# Patient Record
Sex: Male | Born: 1975 | Hispanic: Yes | Marital: Married | Smoking: Current some day smoker
Health system: Southern US, Community
[De-identification: ages and names within clinical notes are randomized; demographics above are authoritative.]

## PROBLEM LIST (undated history)

## (undated) DIAGNOSIS — J302 Other seasonal allergic rhinitis: Secondary | ICD-10-CM

## (undated) DIAGNOSIS — I10 Essential (primary) hypertension: Secondary | ICD-10-CM

## (undated) HISTORY — PX: WISDOM TOOTH EXTRACTION: SHX21

## (undated) HISTORY — PX: LASIK: SHX215

## (undated) HISTORY — DX: Other seasonal allergic rhinitis: J30.2

## (undated) HISTORY — DX: Essential (primary) hypertension: I10

---

## 2016-01-24 ENCOUNTER — Encounter (INDEPENDENT_AMBULATORY_CARE_PROVIDER_SITE_OTHER): Payer: Self-pay

## 2016-01-24 ENCOUNTER — Other Ambulatory Visit: Payer: Self-pay | Admitting: Infectious Disease

## 2016-01-24 ENCOUNTER — Encounter (INDEPENDENT_AMBULATORY_CARE_PROVIDER_SITE_OTHER): Payer: Self-pay | Admitting: *Deleted

## 2016-01-24 VITALS — BP 146/87 | HR 48 | Temp 97.9°F | Resp 16 | Ht 64.0 in | Wt 167.8 lb

## 2016-01-24 DIAGNOSIS — Z006 Encounter for examination for normal comparison and control in clinical research program: Secondary | ICD-10-CM

## 2016-01-24 LAB — CBC WITH DIFFERENTIAL/PLATELET
BASOS ABS: 0.1 10*3/uL (ref 0.0–0.1)
Basophils Relative: 1 % (ref 0–1)
EOS ABS: 0.1 10*3/uL (ref 0.0–0.7)
EOS PCT: 2 % (ref 0–5)
HCT: 43.8 % (ref 39.0–52.0)
Hemoglobin: 15.2 g/dL (ref 13.0–17.0)
LYMPHS PCT: 44 % (ref 12–46)
Lymphs Abs: 2.4 10*3/uL (ref 0.7–4.0)
MCH: 29.3 pg (ref 26.0–34.0)
MCHC: 34.7 g/dL (ref 30.0–36.0)
MCV: 84.4 fL (ref 78.0–100.0)
MPV: 10.5 fL (ref 8.6–12.4)
Monocytes Absolute: 0.3 10*3/uL (ref 0.1–1.0)
Monocytes Relative: 5 % (ref 3–12)
NEUTROS PCT: 48 % (ref 43–77)
Neutro Abs: 2.6 10*3/uL (ref 1.7–7.7)
PLATELETS: 209 10*3/uL (ref 150–400)
RBC: 5.19 MIL/uL (ref 4.22–5.81)
RDW: 13.7 % (ref 11.5–15.5)
WBC: 5.5 10*3/uL (ref 4.0–10.5)

## 2016-01-24 LAB — COMPREHENSIVE METABOLIC PANEL
ALBUMIN: 4.7 g/dL (ref 3.6–5.1)
ALT: 55 U/L — ABNORMAL HIGH (ref 9–46)
AST: 34 U/L (ref 10–40)
Alkaline Phosphatase: 77 U/L (ref 40–115)
BILIRUBIN TOTAL: 0.8 mg/dL (ref 0.2–1.2)
BUN: 13 mg/dL (ref 7–25)
CALCIUM: 9.3 mg/dL (ref 8.6–10.3)
CHLORIDE: 105 mmol/L (ref 98–110)
CO2: 28 mmol/L (ref 20–31)
CREATININE: 1.03 mg/dL (ref 0.60–1.35)
Glucose, Bld: 82 mg/dL (ref 65–99)
Potassium: 4 mmol/L (ref 3.5–5.3)
SODIUM: 140 mmol/L (ref 135–146)
TOTAL PROTEIN: 7.3 g/dL (ref 6.1–8.1)

## 2016-01-24 LAB — HEPATITIS C ANTIBODY: HCV Ab: NEGATIVE

## 2016-01-24 LAB — HEPATITIS B SURFACE ANTIGEN: HEP B S AG: NEGATIVE

## 2016-01-25 LAB — HIV-1 RNA QUANT-NO REFLEX-BLD
HIV 1 RNA Quant: <20 copies/mL (ref <20)
HIV-1 RNA Quant, Log: <1.3 Log copies/mL (ref <1.30)

## 2016-01-26 NOTE — Progress Notes (Signed)
Study: A Phase 2b/3 Double Blind Safety and Efficacy Study of Injectable Cabotegravir compared to Daily Oral Tenofovir Disoproxil Fumarate/Emtricitabine (TDF/FTC), For Pre-Exposure Prophylaxis in HIV-Uninfected Cisgender Men and Transgender Women who have sex with Men.  Medication: Investigational Injectable Cabotegravir/placebo compared to Truvada/placebo. Duration: Around 4 years.  Frederick Hampton is here for [id number] screening visit. After verifying the correct version I explained/reviewed the informed consent in the language that he understood. Risk, benefits, responsibilities, and other options were reviewed. I answered his questions. Comprehension was assessed. He was given adequate time to consider his options. He verbalized understanding and signed the consent witnessed by me. I then gave him a copy of the consent.  HIV counseling was given including description of the testing and how it is done; explained HIV and how it is spread and ways to prevent it; Discussed the meaning of the possible test results and what impact the test results may have on the participant. SexPro was completed by participant and score = 15. He has not been with more than 5 male at birth partners in the past 6 months. He has had comdomless receptive anal intercourse with a male at birth partner whose status is unknown. PTID assigned. Blood drawn at 1545 with no problems. Medical history, medications, bleeding history, and signs/symptoms were reviewed. ECG and vitals were obtained. QTcB = 395 ms.  Complete PE will be performed at Entry visit by the PI. He received $50 gift card for screening visit. If deemed eligible and he is willing to participant in the study then anticipated entry visit is scheduled for Wednesday, 01/31/2016 @ 4pm. Tacey Heap RN

## 2016-01-29 ENCOUNTER — Other Ambulatory Visit: Payer: Self-pay | Admitting: *Deleted

## 2016-01-29 DIAGNOSIS — Z006 Encounter for examination for normal comparison and control in clinical research program: Secondary | ICD-10-CM

## 2016-01-29 NOTE — Addendum Note (Signed)
Addended by: Mariea Clonts D on: 01/29/2016 02:21 PM   Modules accepted: Orders

## 2016-01-30 LAB — HIV ANTIBODY (ROUTINE TESTING W REFLEX): HIV: NONREACTIVE

## 2016-01-31 ENCOUNTER — Other Ambulatory Visit: Payer: Self-pay | Admitting: Infectious Disease

## 2016-01-31 ENCOUNTER — Encounter (INDEPENDENT_AMBULATORY_CARE_PROVIDER_SITE_OTHER): Payer: Self-pay | Admitting: *Deleted

## 2016-01-31 VITALS — BP 137/93 | HR 53 | Temp 98.1°F | Resp 18 | Wt 172.0 lb

## 2016-01-31 DIAGNOSIS — Z006 Encounter for examination for normal comparison and control in clinical research program: Secondary | ICD-10-CM

## 2016-01-31 DIAGNOSIS — J302 Other seasonal allergic rhinitis: Secondary | ICD-10-CM | POA: Insufficient documentation

## 2016-01-31 LAB — LIPID PANEL
CHOL/HDL RATIO: 5.2 ratio — AB (ref <=5.0)
Cholesterol: 186 mg/dL (ref 125–200)
HDL: 36 mg/dL — ABNORMAL LOW (ref >=40)
LDL CALC: 123 mg/dL (ref <130)
Triglycerides: 136 mg/dL (ref <150)
VLDL: 27 mg/dL (ref <30)

## 2016-01-31 LAB — CBC WITH DIFFERENTIAL/PLATELET
BASOS PCT: 0 % (ref 0–1)
Basophils Absolute: 0 10*3/uL (ref 0.0–0.1)
EOS ABS: 0.1 10*3/uL (ref 0.0–0.7)
EOS PCT: 2 % (ref 0–5)
HCT: 42.2 % (ref 39.0–52.0)
Hemoglobin: 15 g/dL (ref 13.0–17.0)
Lymphocytes Relative: 37 % (ref 12–46)
Lymphs Abs: 2 10*3/uL (ref 0.7–4.0)
MCH: 29.9 pg (ref 26.0–34.0)
MCHC: 35.5 g/dL (ref 30.0–36.0)
MCV: 84.1 fL (ref 78.0–100.0)
MONO ABS: 0.3 10*3/uL (ref 0.1–1.0)
MONOS PCT: 5 % (ref 3–12)
MPV: 10 fL (ref 8.6–12.4)
Neutro Abs: 3 10*3/uL (ref 1.7–7.7)
Neutrophils Relative %: 56 % (ref 43–77)
PLATELETS: 210 10*3/uL (ref 150–400)
RBC: 5.02 MIL/uL (ref 4.22–5.81)
RDW: 13.9 % (ref 11.5–15.5)
WBC: 5.3 10*3/uL (ref 4.0–10.5)

## 2016-01-31 LAB — HIV ANTIBODY (ROUTINE TESTING W REFLEX): HIV: NONREACTIVE

## 2016-01-31 LAB — COMPREHENSIVE METABOLIC PANEL
ALT: 55 U/L — ABNORMAL HIGH (ref 9–46)
AST: 33 U/L (ref 10–40)
Albumin: 4.8 g/dL (ref 3.6–5.1)
Alkaline Phosphatase: 78 U/L (ref 40–115)
BUN: 12 mg/dL (ref 7–25)
CHLORIDE: 104 mmol/L (ref 98–110)
CO2: 26 mmol/L (ref 20–31)
CREATININE: 0.92 mg/dL (ref 0.60–1.35)
Calcium: 8.8 mg/dL (ref 8.6–10.3)
Glucose, Bld: 85 mg/dL (ref 65–99)
POTASSIUM: 3.6 mmol/L (ref 3.5–5.3)
SODIUM: 138 mmol/L (ref 135–146)
Total Bilirubin: 1 mg/dL (ref 0.2–1.2)
Total Protein: 7.3 g/dL (ref 6.1–8.1)

## 2016-01-31 LAB — HEPATITIS B SURFACE ANTIBODY,QUALITATIVE: HEP B S AB: POSITIVE — AB

## 2016-01-31 LAB — PHOSPHORUS: Phosphorus: 3.4 mg/dL (ref 2.5–4.5)

## 2016-01-31 LAB — LIPASE: Lipase: 30 U/L (ref 7–60)

## 2016-01-31 LAB — AMYLASE: Amylase: 44 U/L (ref 0–105)

## 2016-01-31 LAB — CK: Total CK: 258 U/L — ABNORMAL HIGH (ref 7–232)

## 2016-01-31 LAB — HEPATITIS B CORE ANTIBODY, TOTAL: HEP B C TOTAL AB: NONREACTIVE

## 2016-01-31 NOTE — Progress Notes (Signed)
Subjective:    Patient ID: Frederick Hampton, male    DOB: 04-Dec-1975, 40 y.o.   MRN: [id number]  HPI  40 year old Latino man who is here to enroll into  HPTN 083. He has history of hypertension by BP here on screening and also endorses a history of seasonal allergies that have recently flared. Upon reviewing her EKG he was bothered by MUSE reading of incomplete RBBB. I explained nature of conduction delays in the heart. Upon questioning regarding any cardiac history he endorsed what to me sounded like highly atypical chest pain with pain in middle of chest fleeting lasting a few seconds and occurring during times of emotional stress.   He is here for CPE for 083  Past Medical History  Diagnosis Date  . Seasonal allergies   . HTN (hypertension)     Past Surgical History  Procedure Laterality Date  . Lasik    . Wisdom tooth extraction      Family History  Problem Relation Age of Onset  . Diabetes Mother   . Diabetes Father   . CAD Father   . Diabetes Brother       Social History   Social History  . Marital Status: Married    Spouse Name: N/A  . Number of Children: N/A  . Years of Education: N/A   Social History Main Topics  . Smoking status: Current Some Day Smoker    Types: Cigarettes    Start date: 12/02/2005  . Smokeless tobacco: None     Comment: 1 cigarrette per day  . Alcohol Use: 0.6 oz/week    1 Standard drinks or equivalent per week  . Drug Use: None  . Sexual Activity: Not Asked   Other Topics Concern  . None   Social History Narrative    No Known Allergies  No current outpatient prescriptions on file.   Review of Systems  Constitutional: Negative for fever, chills, diaphoresis, activity change, appetite change, fatigue and unexpected weight change.  HENT: Negative for congestion, rhinorrhea, sinus pressure, sneezing, sore throat and trouble swallowing.   Eyes: Negative for photophobia and visual disturbance.  Respiratory: Negative for cough,  chest tightness, shortness of breath, wheezing and stridor.   Cardiovascular: Negative for chest pain, palpitations and leg swelling.  Gastrointestinal: Negative for nausea, vomiting, abdominal pain, diarrhea, constipation, blood in stool, abdominal distention and anal bleeding.  Genitourinary: Negative for dysuria, hematuria, flank pain and difficulty urinating.  Musculoskeletal: Negative for myalgias, back pain, joint swelling, arthralgias and gait problem.  Skin: Negative for color change, pallor, rash and wound.  Neurological: Negative for dizziness, tremors, weakness and light-headedness.  Hematological: Negative for adenopathy. Does not bruise/bleed easily.  Psychiatric/Behavioral: Negative for behavioral problems, confusion, sleep disturbance, dysphoric mood, decreased concentration and agitation.       Objective:   Physical Exam  Constitutional: He is oriented to person, place, and time. He appears well-developed and well-nourished.  HENT:  Head: Normocephalic and atraumatic.  Eyes: Conjunctivae and EOM are normal.  Neck: Normal range of motion. Neck supple. No JVD present. No tracheal deviation present.  Cardiovascular: Normal rate, regular rhythm and normal heart sounds.  Exam reveals no gallop and no friction rub.   No murmur heard. Pulmonary/Chest: Effort normal and breath sounds normal. No stridor. No respiratory distress. He has no wheezes. He has no rales. He exhibits no tenderness.  Abdominal: Soft. Bowel sounds are normal. He exhibits no distension. There is no tenderness. There is no rebound.  Musculoskeletal:  Normal range of motion. He exhibits no edema or tenderness.  Lymphadenopathy:       Head (right side): No submental, no submandibular, no tonsillar, no preauricular, no posterior auricular and no occipital adenopathy present.       Head (left side): No submental, no submandibular, no tonsillar, no preauricular, no posterior auricular and no occipital adenopathy  present.    He has no cervical adenopathy.    He has no axillary adenopathy.       Right: No supraclavicular adenopathy present.       Left: No supraclavicular adenopathy present.  Neurological: He is alert and oriented to person, place, and time.  Skin: Skin is warm and dry. No rash noted. No erythema. No pallor.  Psychiatric: He has a normal mood and affect. His behavior is normal. Judgment and thought content normal.  Vitals reviewed.         Assessment & Plan:   Patient ready to enroll into HPTN 083. All labs reviewed as was his EKG that showed Sinus bradycardia with incomplete RBBB. He is eligible and ready to enroll into HPTN 083

## 2016-02-01 LAB — POCT URINALYSIS DIPSTICK
Bilirubin, UA: NEGATIVE
Glucose, UA: NEGATIVE
Leukocytes, UA: NEGATIVE
Nitrite, UA: NEGATIVE
PH UA: 7
PROTEIN UA: NEGATIVE
SPEC GRAV UA: 1.015
UROBILINOGEN UA: 0.2

## 2016-02-01 LAB — GC/CHLAMYDIA PROBE AMP
CT Probe RNA: NOT DETECTED
GC PROBE AMP APTIMA: NOT DETECTED

## 2016-02-01 NOTE — Addendum Note (Signed)
Addended by: Nicolasa Ducking on: 02/01/2016 10:02 AM   Modules accepted: Orders

## 2016-02-02 LAB — RPR

## 2016-02-06 LAB — CT/NG RNA, TMA RECTAL
CHLAMYDIA TRACHOMATIS RNA: NOT DETECTED
Neisseria Gonorrhoeae RNA: NOT DETECTED

## 2016-02-06 NOTE — Progress Notes (Signed)
Study: A Phase 2b/3 Double Blind Safety and Efficacy Study of Injectable Cabotegravir compared to Daily Oral Tenofovir Disoproxil Fumarate/Emtricitabine (TDF/FTC), For Pre-Exposure Prophylaxis in HIV-Uninfected Cisgender Men and Transgender Women who have sex with Men.  Medication: Investigational Injectable Cabotegravir/placebo compared to Truvada/placebo. Duration: Around 4 years.  [name]. After confirming his willingness to enroll into study we discussed HIV testing and how it is performed. I explained how HIV is spread and ways to prevent/reduce this. Discussed potential results of the test, the meaning, and the way it may impact him. I assessed his comprehension and obtained fasting blood. Vital signs are at baseline. No changes to medications. Does c/o seasonal allergies. CPE performed by PI (see attached exam). Confirmed HIV Rapid to be nonreactive. Questionnaires completed. No changes to social impacts or locator information.Study meds were dispensed. We reviewed how to administer each, potential side effects, and given my contact information should he have any concerns or questions. He verbalized understanding He plans on taking study medication in the morning and get his husband to help remind him. I discussed other ways to set reminders like using his phone or place medications by a routine thing that he does on a daily basis at the same time of the day. HE received $50 gift card, condoms, and lubricant at this visit. Next appointment scheduled in 2 weeks. Tacey HeapElisha Epperson RN

## 2016-02-14 ENCOUNTER — Encounter (INDEPENDENT_AMBULATORY_CARE_PROVIDER_SITE_OTHER): Payer: Self-pay | Admitting: *Deleted

## 2016-02-14 VITALS — BP 117/75 | HR 60 | Temp 98.2°F | Resp 16 | Wt 172.0 lb

## 2016-02-14 DIAGNOSIS — M79622 Pain in left upper arm: Secondary | ICD-10-CM

## 2016-02-14 DIAGNOSIS — K219 Gastro-esophageal reflux disease without esophagitis: Secondary | ICD-10-CM

## 2016-02-14 DIAGNOSIS — Z006 Encounter for examination for normal comparison and control in clinical research program: Secondary | ICD-10-CM

## 2016-02-14 DIAGNOSIS — M79621 Pain in right upper arm: Secondary | ICD-10-CM

## 2016-02-15 LAB — CBC WITH DIFFERENTIAL/PLATELET
BASOS ABS: 0 10*3/uL (ref 0.0–0.1)
Basophils Relative: 0 % (ref 0–1)
EOS PCT: 2 % (ref 0–5)
Eosinophils Absolute: 0.1 10*3/uL (ref 0.0–0.7)
HEMATOCRIT: 44 % (ref 39.0–52.0)
HEMOGLOBIN: 15.3 g/dL (ref 13.0–17.0)
LYMPHS ABS: 2.1 10*3/uL (ref 0.7–4.0)
LYMPHS PCT: 44 % (ref 12–46)
MCH: 29.5 pg (ref 26.0–34.0)
MCHC: 34.8 g/dL (ref 30.0–36.0)
MCV: 84.8 fL (ref 78.0–100.0)
MONO ABS: 0.3 10*3/uL (ref 0.1–1.0)
MPV: 10 fL (ref 8.6–12.4)
Monocytes Relative: 7 % (ref 3–12)
Neutro Abs: 2.3 10*3/uL (ref 1.7–7.7)
Neutrophils Relative %: 47 % (ref 43–77)
PLATELETS: 234 10*3/uL (ref 150–400)
RBC: 5.19 MIL/uL (ref 4.22–5.81)
RDW: 13.9 % (ref 11.5–15.5)
WBC: 4.8 10*3/uL (ref 4.0–10.5)

## 2016-02-15 LAB — COMPREHENSIVE METABOLIC PANEL
ALK PHOS: 69 U/L (ref 40–115)
ALT: 58 U/L — AB (ref 9–46)
AST: 32 U/L (ref 10–40)
Albumin: 4.3 g/dL (ref 3.6–5.1)
BILIRUBIN TOTAL: 0.7 mg/dL (ref 0.2–1.2)
BUN: 11 mg/dL (ref 7–25)
CALCIUM: 9 mg/dL (ref 8.6–10.3)
CO2: 25 mmol/L (ref 20–31)
Chloride: 104 mmol/L (ref 98–110)
Creat: 1.03 mg/dL (ref 0.60–1.35)
GLUCOSE: 83 mg/dL (ref 65–99)
Potassium: 3.9 mmol/L (ref 3.5–5.3)
Sodium: 139 mmol/L (ref 135–146)
TOTAL PROTEIN: 6.9 g/dL (ref 6.1–8.1)

## 2016-02-15 LAB — PHOSPHORUS: PHOSPHORUS: 3 mg/dL (ref 2.5–4.5)

## 2016-02-15 LAB — HIV ANTIBODY (ROUTINE TESTING W REFLEX): HIV 1&2 Ab, 4th Generation: NONREACTIVE

## 2016-02-15 LAB — CK: Total CK: 208 U/L (ref 7–232)

## 2016-02-15 LAB — LIPASE: Lipase: 34 U/L (ref 7–60)

## 2016-02-15 LAB — AMYLASE: Amylase: 36 U/L (ref 0–105)

## 2016-02-15 MED ORDER — DICLOFENAC SODIUM 1 % TD GEL: 2.0000 g | Freq: Four times a day (QID) | TRANSDERMAL | Status: DC | PRN

## 2016-02-15 MED ORDER — OMEPRAZOLE 20 MG PO CPDR: 20.0000 mg | DELAYED_RELEASE_CAPSULE | Freq: Every day | ORAL | Status: DC

## 2016-02-15 NOTE — Progress Notes (Signed)
Study: A Phase 2b/3 Double Blind Safety and Efficacy Study of Injectable Cabotegravir compared to Daily Oral Tenofovir Disoproxil Fumarate/Emtricitabine (TDF/FTC), For Pre-Exposure Prophylaxis in HIV-Uninfected Cisgender Men and Transgender Women who have sex with Men.  Medication: Investigational Injectable Cabotegravir/placebo compared to Truvada/placebo. Duration: Around 4 years.  [name] is here for week 2. Blood drawn with no problems and HIV rapid confirmed to be non-reactive. He c/o of mild lower back pain and mild heartburn that both started around a week ago. He has been adherent with his study regimen with a pill count of #45 for both. He takes them at 12 noon every day. Vitals are stable and his blood pressure is lower than prior reads. He states he has been ingesting a spoonful of vinegar in the mornings which is supposed to help with his BP and cholesterol. I mentioned that his heartburn soul possibly be stemming from the intake of vinegar. He received $50 giftcard and will return for his week 4 on 3/28 at 4pm. Frederick HeapElisha Rosaisela Jamroz RN

## 2016-02-27 ENCOUNTER — Encounter (INDEPENDENT_AMBULATORY_CARE_PROVIDER_SITE_OTHER): Payer: Self-pay | Admitting: *Deleted

## 2016-02-27 VITALS — BP 128/82 | HR 48 | Temp 97.9°F | Resp 16 | Wt 174.0 lb

## 2016-02-27 DIAGNOSIS — Z006 Encounter for examination for normal comparison and control in clinical research program: Secondary | ICD-10-CM

## 2016-02-27 LAB — CBC WITH DIFFERENTIAL/PLATELET
BASOS PCT: 0 % (ref 0–1)
Basophils Absolute: 0 10*3/uL (ref 0.0–0.1)
Eosinophils Absolute: 0.1 10*3/uL (ref 0.0–0.7)
Eosinophils Relative: 2 % (ref 0–5)
HCT: 42.2 % (ref 39.0–52.0)
HEMOGLOBIN: 14.5 g/dL (ref 13.0–17.0)
Lymphocytes Relative: 49 % — ABNORMAL HIGH (ref 12–46)
Lymphs Abs: 2.5 10*3/uL (ref 0.7–4.0)
MCH: 29.1 pg (ref 26.0–34.0)
MCHC: 34.4 g/dL (ref 30.0–36.0)
MCV: 84.7 fL (ref 78.0–100.0)
MPV: 10 fL (ref 8.6–12.4)
Monocytes Absolute: 0.3 10*3/uL (ref 0.1–1.0)
Monocytes Relative: 5 % (ref 3–12)
NEUTROS ABS: 2.2 10*3/uL (ref 1.7–7.7)
NEUTROS PCT: 44 % (ref 43–77)
Platelets: 225 10*3/uL (ref 150–400)
RBC: 4.98 MIL/uL (ref 4.22–5.81)
RDW: 13.6 % (ref 11.5–15.5)
WBC: 5 10*3/uL (ref 4.0–10.5)

## 2016-02-27 NOTE — Progress Notes (Signed)
Study: A Phase 2b/3 Double Blind Safety and Efficacy Study of Injectable Cabotegravir compared to Daily Oral Tenofovir Disoproxil Fumarate/Emtricitabine (TDF/FTC), For Pre-Exposure Prophylaxis in HIV-Uninfected Cisgender Men and Transgender Women who have sex with Men.  Medication: Investigational Injectable Cabotegravir/placebo compared to Truvada/placebo. Duration: Around 4 years  Frederick Hampton here for week 4 visit. No new complaints verbalized. Rapid HIV non-reactive. Good adherence with his medication with 34 pills remaining of both. Met with [name], Community Outreach Coordinator, to review/discuss adherence and risk reduction. Next visit scheduled for next week. 

## 2016-02-28 LAB — HIV ANTIBODY (ROUTINE TESTING W REFLEX): HIV: NONREACTIVE

## 2016-02-28 LAB — COMPREHENSIVE METABOLIC PANEL
ALBUMIN: 4.6 g/dL (ref 3.6–5.1)
ALT: 41 U/L (ref 9–46)
AST: 24 U/L (ref 10–40)
Alkaline Phosphatase: 80 U/L (ref 40–115)
BILIRUBIN TOTAL: 0.6 mg/dL (ref 0.2–1.2)
BUN: 12 mg/dL (ref 7–25)
CO2: 23 mmol/L (ref 20–31)
CREATININE: 1.03 mg/dL (ref 0.60–1.35)
Calcium: 9.2 mg/dL (ref 8.6–10.3)
Chloride: 103 mmol/L (ref 98–110)
Glucose, Bld: 95 mg/dL (ref 65–99)
Potassium: 3.9 mmol/L (ref 3.5–5.3)
SODIUM: 139 mmol/L (ref 135–146)
TOTAL PROTEIN: 6.9 g/dL (ref 6.1–8.1)

## 2016-02-28 LAB — LIPASE: Lipase: 28 U/L (ref 7–60)

## 2016-02-28 LAB — AMYLASE: Amylase: 44 U/L (ref 0–105)

## 2016-02-28 LAB — CK: CK TOTAL: 202 U/L (ref 7–232)

## 2016-02-28 LAB — PHOSPHORUS: PHOSPHORUS: 3.3 mg/dL (ref 2.5–4.5)

## 2016-03-06 ENCOUNTER — Encounter (INDEPENDENT_AMBULATORY_CARE_PROVIDER_SITE_OTHER): Payer: Self-pay | Admitting: *Deleted

## 2016-03-06 VITALS — BP 125/76 | HR 54 | Temp 98.2°F | Wt 173.0 lb

## 2016-03-06 DIAGNOSIS — Z006 Encounter for examination for normal comparison and control in clinical research program: Secondary | ICD-10-CM

## 2016-03-06 LAB — HIV ANTIBODY (ROUTINE TESTING W REFLEX): HIV: NONREACTIVE

## 2016-03-06 NOTE — Progress Notes (Signed)
Study: A Phase 2b/3 Double Blind Safety and Efficacy Study of Injectable Cabotegravir compared to Daily Oral Tenofovir Disoproxil Fumarate/Emtricitabine (TDF/FTC), For Pre-Exposure Prophylaxis in HIV-Uninfected Cisgender Men and Transgender Women who have sex with Men.  Medication: Investigational Injectable Cabotegravir/placebo compared to Truvada/placebo. Duration: Around 4 years.  [name] here for week 5 visit. No new complaints verbalized. Good adherence with his study medication.  Rapid HIV non-reactive. He received his first cabotegravir/placebo injection, LUQ (L) buttock. Tolerated well with no complaints. He has clinic contact numbers for questions/concerns if needed. He will return next week for injection site follow-up.

## 2016-03-12 ENCOUNTER — Encounter: Payer: Self-pay | Admitting: *Deleted

## 2016-03-12 VITALS — BP 128/88 | HR 65 | Temp 98.4°F | Resp 16 | Wt 173.5 lb

## 2016-03-12 DIAGNOSIS — Z006 Encounter for examination for normal comparison and control in clinical research program: Secondary | ICD-10-CM

## 2016-03-12 LAB — CBC WITH DIFFERENTIAL/PLATELET
BASOS PCT: 0 %
Basophils Absolute: 0 cells/uL (ref 0–200)
EOS ABS: 171 {cells}/uL (ref 15–500)
Eosinophils Relative: 3 %
HCT: 43 % (ref 38.5–50.0)
Hemoglobin: 14.9 g/dL (ref 13.2–17.1)
LYMPHS PCT: 50 %
Lymphs Abs: 2850 cells/uL (ref 850–3900)
MCH: 29.1 pg (ref 27.0–33.0)
MCHC: 34.7 g/dL (ref 32.0–36.0)
MCV: 84 fL (ref 80.0–100.0)
MPV: 10 fL (ref 7.5–12.5)
Monocytes Absolute: 285 cells/uL (ref 200–950)
Monocytes Relative: 5 %
Neutro Abs: 2394 cells/uL (ref 1500–7800)
Neutrophils Relative %: 42 %
Platelets: 247 10*3/uL (ref 140–400)
RBC: 5.12 MIL/uL (ref 4.20–5.80)
RDW: 14 % (ref 11.0–15.0)
WBC: 5.7 10*3/uL (ref 3.8–10.8)

## 2016-03-12 LAB — HIV ANTIBODY (ROUTINE TESTING W REFLEX): HIV 1&2 Ab, 4th Generation: NONREACTIVE

## 2016-03-13 LAB — LIPASE: Lipase: 28 U/L (ref 7–60)

## 2016-03-13 LAB — COMPREHENSIVE METABOLIC PANEL
ALBUMIN: 4.7 g/dL (ref 3.6–5.1)
ALT: 37 U/L (ref 9–46)
AST: 30 U/L (ref 10–40)
Alkaline Phosphatase: 85 U/L (ref 40–115)
BUN: 10 mg/dL (ref 7–25)
CHLORIDE: 100 mmol/L (ref 98–110)
CO2: 24 mmol/L (ref 20–31)
CREATININE: 0.95 mg/dL (ref 0.60–1.35)
Calcium: 9.3 mg/dL (ref 8.6–10.3)
GLUCOSE: 87 mg/dL (ref 65–99)
Potassium: 4 mmol/L (ref 3.5–5.3)
SODIUM: 139 mmol/L (ref 135–146)
Total Bilirubin: 0.4 mg/dL (ref 0.2–1.2)
Total Protein: 7.2 g/dL (ref 6.1–8.1)

## 2016-03-13 LAB — AMYLASE: Amylase: 39 U/L (ref 0–105)

## 2016-03-13 LAB — CK: CK TOTAL: 393 U/L — AB (ref 7–232)

## 2016-03-13 LAB — PHOSPHORUS: PHOSPHORUS: 3.2 mg/dL (ref 2.5–4.5)

## 2016-03-13 NOTE — Progress Notes (Signed)
[name] is here for his week 6 visit for Study: A Phase 2b/3 Double Blind Safety and Efficacy Study of Injectable Cabotegravir compared to Daily Oral Tenofovir Disoproxil Fumarate/Emtricitabine (TDF/FTC), For Pre-Exposure Prophylaxis in HIV-Uninfected Cisgender Men and Transgender Women who have sex with Men.  Medication: Investigational Injectable Cabotegravir/placebo compared to Truvada/placebo. Duration: Around 4 years.  He received his first injection of study drug last week and reports having significant reactions. He said the next day (Thursday) he woke up with severe soreness in his left buttock area that went from his midback to lower thigh on the left side. This persisted for 2 days and then gradually subsided by Sunday.  He took ibuprofen for symptomatic relief which helped. He also noticed that during those 2 days of intense soreness he was having generalized achiness and malaise. He was not able to visualize the area, but said it felt tender and warm to the touch. Now, there is no redness or signs of imflammation at the site.Marland Kitchen.He was able to work through all of this. Over the past 2 nights, he woke up feeling hot, but not sweaty and also has been having some really vivid, strange dreams. He denied having any other neuro. Sx. He said he definitely felt like these symptoms were all side effects of the injection and not a viral illness, since he didn't have any kind of respiratory sx. He did say that he would try to take the injection again, but if the effects were any worse, then he would need stop them.  He has been very adherent with his other study meds and denies any other problems. He is to return in 3 weeks for the next injection and was instructed to call for any other concerning problems.

## 2016-03-18 ENCOUNTER — Other Ambulatory Visit: Payer: Self-pay | Admitting: Infectious Disease

## 2016-03-18 DIAGNOSIS — K219 Gastro-esophageal reflux disease without esophagitis: Secondary | ICD-10-CM

## 2016-03-18 MED ORDER — OMEPRAZOLE 20 MG PO CPDR: 20.0000 mg | DELAYED_RELEASE_CAPSULE | Freq: Every day | ORAL | Status: DC

## 2016-04-03 ENCOUNTER — Encounter (INDEPENDENT_AMBULATORY_CARE_PROVIDER_SITE_OTHER): Payer: Self-pay | Admitting: *Deleted

## 2016-04-03 VITALS — BP 132/86 | HR 58 | Temp 97.9°F | Wt 170.5 lb

## 2016-04-03 DIAGNOSIS — Z006 Encounter for examination for normal comparison and control in clinical research program: Secondary | ICD-10-CM

## 2016-04-03 LAB — COMPREHENSIVE METABOLIC PANEL
ALK PHOS: 89 U/L (ref 40–115)
ALT: 41 U/L (ref 9–46)
AST: 29 U/L (ref 10–40)
Albumin: 4.9 g/dL (ref 3.6–5.1)
BILIRUBIN TOTAL: 0.7 mg/dL (ref 0.2–1.2)
BUN: 18 mg/dL (ref 7–25)
CO2: 24 mmol/L (ref 20–31)
CREATININE: 1.09 mg/dL (ref 0.60–1.35)
Calcium: 9.4 mg/dL (ref 8.6–10.3)
Chloride: 104 mmol/L (ref 98–110)
Glucose, Bld: 106 mg/dL — ABNORMAL HIGH (ref 65–99)
POTASSIUM: 3.8 mmol/L (ref 3.5–5.3)
SODIUM: 139 mmol/L (ref 135–146)
TOTAL PROTEIN: 7.4 g/dL (ref 6.1–8.1)

## 2016-04-03 LAB — CBC WITH DIFFERENTIAL/PLATELET
BASOS PCT: 0 %
Basophils Absolute: 0 cells/uL (ref 0–200)
EOS ABS: 102 {cells}/uL (ref 15–500)
EOS PCT: 2 %
HCT: 43.3 % (ref 38.5–50.0)
Hemoglobin: 15.1 g/dL (ref 13.2–17.1)
LYMPHS PCT: 42 %
Lymphs Abs: 2142 cells/uL (ref 850–3900)
MCH: 29.1 pg (ref 27.0–33.0)
MCHC: 34.9 g/dL (ref 32.0–36.0)
MCV: 83.4 fL (ref 80.0–100.0)
MONOS PCT: 5 %
MPV: 10.6 fL (ref 7.5–12.5)
Monocytes Absolute: 255 cells/uL (ref 200–950)
NEUTROS ABS: 2601 {cells}/uL (ref 1500–7800)
Neutrophils Relative %: 51 %
PLATELETS: 238 10*3/uL (ref 140–400)
RBC: 5.19 MIL/uL (ref 4.20–5.80)
RDW: 13.6 % (ref 11.0–15.0)
WBC: 5.1 10*3/uL (ref 3.8–10.8)

## 2016-04-03 LAB — CK: CK TOTAL: 217 U/L (ref 7–232)

## 2016-04-03 LAB — PHOSPHORUS: PHOSPHORUS: 3.5 mg/dL (ref 2.5–4.5)

## 2016-04-03 LAB — AMYLASE: Amylase: 44 U/L (ref 0–105)

## 2016-04-03 LAB — LIPASE: LIPASE: 30 U/L (ref 7–60)

## 2016-04-04 LAB — HIV ANTIBODY (ROUTINE TESTING W REFLEX): HIV: NONREACTIVE

## 2016-04-04 NOTE — Progress Notes (Signed)
Study: A Phase 2b/3 Double Blind Safety and Efficacy Study of Injectable Cabotegravir compared to Daily Oral Tenofovir Disoproxil Fumarate/Emtricitabine (TDF/FTC), For Pre-Exposure Prophylaxis in HIV-Uninfected Cisgender Men and Transgender Women who have sex with Men.  Medication: Investigational Injectable Cabotegravir/placebo compared to Truvada/placebo. Duration: Around 4 years.  [name] here for week 9 visit. No new complaints or concerns verbalized. Blood drawn and his rapid HIV non-reactive. Cabotegravir/placebo injection administered (R) gluteal muscle. Site unremarkable. Next study visit 5/9 @ 4:00pm.

## 2016-04-09 ENCOUNTER — Encounter (INDEPENDENT_AMBULATORY_CARE_PROVIDER_SITE_OTHER): Payer: Self-pay | Admitting: *Deleted

## 2016-04-09 VITALS — BP 119/78 | HR 52 | Temp 97.9°F | Wt 174.0 lb

## 2016-04-09 DIAGNOSIS — Z006 Encounter for examination for normal comparison and control in clinical research program: Secondary | ICD-10-CM

## 2016-04-09 LAB — CBC WITH DIFFERENTIAL/PLATELET
BASOS PCT: 0 %
Basophils Absolute: 0 cells/uL (ref 0–200)
EOS ABS: 96 {cells}/uL (ref 15–500)
Eosinophils Relative: 2 %
HEMATOCRIT: 42.1 % (ref 38.5–50.0)
HEMOGLOBIN: 14.3 g/dL (ref 13.2–17.1)
LYMPHS ABS: 2208 {cells}/uL (ref 850–3900)
LYMPHS PCT: 46 %
MCH: 28.5 pg (ref 27.0–33.0)
MCHC: 34 g/dL (ref 32.0–36.0)
MCV: 84 fL (ref 80.0–100.0)
MONO ABS: 192 {cells}/uL — AB (ref 200–950)
MPV: 10 fL (ref 7.5–12.5)
Monocytes Relative: 4 %
NEUTROS PCT: 48 %
Neutro Abs: 2304 cells/uL (ref 1500–7800)
Platelets: 227 10*3/uL (ref 140–400)
RBC: 5.01 MIL/uL (ref 4.20–5.80)
RDW: 13.6 % (ref 11.0–15.0)
WBC: 4.8 10*3/uL (ref 3.8–10.8)

## 2016-04-10 LAB — COMPREHENSIVE METABOLIC PANEL
ALBUMIN: 4.2 g/dL (ref 3.6–5.1)
ALT: 36 U/L (ref 9–46)
AST: 23 U/L (ref 10–40)
Alkaline Phosphatase: 78 U/L (ref 40–115)
BUN: 12 mg/dL (ref 7–25)
CALCIUM: 8.9 mg/dL (ref 8.6–10.3)
CHLORIDE: 104 mmol/L (ref 98–110)
CO2: 22 mmol/L (ref 20–31)
CREATININE: 1.03 mg/dL (ref 0.60–1.35)
Glucose, Bld: 101 mg/dL — ABNORMAL HIGH (ref 65–99)
POTASSIUM: 3.6 mmol/L (ref 3.5–5.3)
Sodium: 137 mmol/L (ref 135–146)
TOTAL PROTEIN: 7.1 g/dL (ref 6.1–8.1)
Total Bilirubin: 0.5 mg/dL (ref 0.2–1.2)

## 2016-04-10 LAB — PHOSPHORUS: Phosphorus: 2.9 mg/dL (ref 2.5–4.5)

## 2016-04-10 LAB — AMYLASE: AMYLASE: 38 U/L (ref 0–105)

## 2016-04-10 LAB — LIPASE: LIPASE: 25 U/L (ref 7–60)

## 2016-04-10 LAB — HIV ANTIBODY (ROUTINE TESTING W REFLEX): HIV 1&2 Ab, 4th Generation: NONREACTIVE

## 2016-04-10 LAB — CK: Total CK: 179 U/L (ref 7–232)

## 2016-04-10 NOTE — Progress Notes (Signed)
Study: A Phase 2b/3 Double Blind Safety and Efficacy Study of Injectable Cabotegravir compared to Daily Oral Tenofovir Disoproxil Fumarate/Emtricitabine (TDF/FTC), For Pre-Exposure Prophylaxis in HIV-Uninfected Cisgender Men and Transgender Women who have sex with Men.  Medication: Investigational Injectable Cabotegravir/placebo compared to Truvada/placebo. Duration: Around 4 years.  [name] here for his week 10 visit. States mild reaction post injection. States that he noted mild tenderness at the injection site which started the day after he received his injection. He did take ibuprofen for symptomatic relief. States that he also experienced mild fatigue. States s/sx's lasted 3 days and were not as intense as what he had experience after his initial injection. No changes in activity or ADL's. No redness, warmth or swelling at injection site. Site was unremarkable upon visual inspection at visit today. States that he feels much better at this time continuing on with the study and with the injections. Will continue to monitor closely. He is schedule to return in July for his next injection.

## 2016-05-24 ENCOUNTER — Telehealth: Payer: Self-pay

## 2016-05-24 NOTE — Telephone Encounter (Signed)
?   help study participants become as adherent as possible (100% adherence is unlikely/unrealistic) and to provide a space for them to be honest with you about their true adherence ? helping people understand the relationships between their thoughts, feelings, and behaviors and not just giving advice or telling people what to do. Instead try to be: o Nonjudgmental o Collaborative o Normalize participant difficulties This study will be conducted in three phases, or "steps."  . Step I: all participants will receive two oral study products. . Step II: participants will receive one oral product and one injectable product. . Step III: participants will either be offered 48 weeks of open label PrEP Our conversation will primarily focus on: 1. Getting to study appointments, obtaining refills of oral product, and receiving injections on time. 2. Communicating with study staff about questions and concerns. 3. Coping with side effects. 4. Formulating a daily medication schedule and reminders for oral study product. 5. Storing oral study product. 6. Handling missed doses of oral study product and missed injections. 7. Harm reduction/education  

## 2016-06-03 ENCOUNTER — Encounter (INDEPENDENT_AMBULATORY_CARE_PROVIDER_SITE_OTHER): Payer: Self-pay | Admitting: *Deleted

## 2016-06-03 ENCOUNTER — Telehealth: Payer: Self-pay

## 2016-06-03 VITALS — BP 121/78 | HR 51 | Temp 98.1°F | Wt 172.5 lb

## 2016-06-03 DIAGNOSIS — Z006 Encounter for examination for normal comparison and control in clinical research program: Secondary | ICD-10-CM

## 2016-06-03 LAB — AMYLASE: Amylase: 44 U/L (ref 0–105)

## 2016-06-03 LAB — COMPREHENSIVE METABOLIC PANEL
ALK PHOS: 81 U/L (ref 40–115)
ALT: 48 U/L — AB (ref 9–46)
AST: 32 U/L (ref 10–40)
Albumin: 4.8 g/dL (ref 3.6–5.1)
BILIRUBIN TOTAL: 0.6 mg/dL (ref 0.2–1.2)
BUN: 12 mg/dL (ref 7–25)
CO2: 27 mmol/L (ref 20–31)
CREATININE: 0.99 mg/dL (ref 0.60–1.35)
Calcium: 9 mg/dL (ref 8.6–10.3)
Chloride: 103 mmol/L (ref 98–110)
Glucose, Bld: 78 mg/dL (ref 65–99)
POTASSIUM: 3.8 mmol/L (ref 3.5–5.3)
SODIUM: 139 mmol/L (ref 135–146)
TOTAL PROTEIN: 7 g/dL (ref 6.1–8.1)

## 2016-06-03 LAB — LIPASE: Lipase: 30 U/L (ref 7–60)

## 2016-06-03 LAB — CBC WITH DIFFERENTIAL/PLATELET
BASOS PCT: 0 %
Basophils Absolute: 0 cells/uL (ref 0–200)
EOS ABS: 108 {cells}/uL (ref 15–500)
EOS PCT: 2 %
HCT: 44.3 % (ref 38.5–50.0)
Hemoglobin: 15.3 g/dL (ref 13.2–17.1)
LYMPHS ABS: 1782 {cells}/uL (ref 850–3900)
Lymphocytes Relative: 33 %
MCH: 29.1 pg (ref 27.0–33.0)
MCHC: 34.5 g/dL (ref 32.0–36.0)
MCV: 84.4 fL (ref 80.0–100.0)
MONOS PCT: 6 %
MPV: 10.3 fL (ref 7.5–12.5)
Monocytes Absolute: 324 cells/uL (ref 200–950)
NEUTROS ABS: 3186 {cells}/uL (ref 1500–7800)
Neutrophils Relative %: 59 %
PLATELETS: 213 10*3/uL (ref 140–400)
RBC: 5.25 MIL/uL (ref 4.20–5.80)
RDW: 13.6 % (ref 11.0–15.0)
WBC: 5.4 10*3/uL (ref 3.8–10.8)

## 2016-06-03 LAB — CK: CK TOTAL: 303 U/L — AB (ref 7–232)

## 2016-06-03 LAB — PHOSPHORUS: Phosphorus: 3 mg/dL (ref 2.5–4.5)

## 2016-06-03 NOTE — Telephone Encounter (Signed)
Check in and provide study information

## 2016-06-03 NOTE — Progress Notes (Signed)
Study: A Phase 2b/3 Double Blind Safety and Efficacy Study of Injectable Cabotegravir compared to Daily Oral Tenofovir Disoproxil Fumarate/Emtricitabine (TDF/FTC), For Pre-Exposure Prophylaxis in HIV-Uninfected Cisgender Men and Transgender Women who have sex with Men.  Medication: Investigational Injectable Cabotegravir/placebo compared to Truvada/placebo. Duration: Around 4 years.  [name] is here for his week 17 visit. Has noticed some mild intermittent flank discomfort since starting on study. States that discomfort improves/resolves when he increases his PO intake of water. Denies any dysuria, urgency or increase frequency. No other new complaints or concerns verbalized. States that he missed 1 dose of his study medication since his last visit. Rapid HIV non-reactive. Next visit scheduled for 06/12/16 @ 4:00pm.

## 2016-06-04 LAB — HIV ANTIBODY (ROUTINE TESTING W REFLEX): HIV 1&2 Ab, 4th Generation: NONREACTIVE

## 2016-06-12 ENCOUNTER — Encounter (INDEPENDENT_AMBULATORY_CARE_PROVIDER_SITE_OTHER): Payer: Self-pay | Admitting: *Deleted

## 2016-06-12 ENCOUNTER — Encounter: Payer: Self-pay | Admitting: *Deleted

## 2016-06-12 VITALS — BP 125/78 | HR 57 | Temp 98.1°F | Wt 174.0 lb

## 2016-06-12 DIAGNOSIS — Z006 Encounter for examination for normal comparison and control in clinical research program: Secondary | ICD-10-CM

## 2016-06-12 LAB — CBC WITH DIFFERENTIAL/PLATELET
BASOS PCT: 0 %
Basophils Absolute: 0 cells/uL (ref 0–200)
EOS ABS: 100 {cells}/uL (ref 15–500)
EOS PCT: 2 %
HCT: 43.6 % (ref 38.5–50.0)
HEMOGLOBIN: 15 g/dL (ref 13.2–17.1)
LYMPHS ABS: 2300 {cells}/uL (ref 850–3900)
Lymphocytes Relative: 46 %
MCH: 29 pg (ref 27.0–33.0)
MCHC: 34.4 g/dL (ref 32.0–36.0)
MCV: 84.3 fL (ref 80.0–100.0)
MONOS PCT: 6 %
MPV: 10.2 fL (ref 7.5–12.5)
Monocytes Absolute: 300 cells/uL (ref 200–950)
NEUTROS ABS: 2300 {cells}/uL (ref 1500–7800)
Neutrophils Relative %: 46 %
PLATELETS: 245 10*3/uL (ref 140–400)
RBC: 5.17 MIL/uL (ref 4.20–5.80)
RDW: 13.6 % (ref 11.0–15.0)
WBC: 5 10*3/uL (ref 3.8–10.8)

## 2016-06-12 NOTE — Progress Notes (Signed)
Study: A Phase 2b/3 Double Blind Safety and Efficacy Study of Injectable Cabotegravir compared to Daily Oral Tenofovir Disoproxil Fumarate/Emtricitabine (TDF/FTC), For Pre-Exposure Prophylaxis in HIV-Uninfected Cisgender Men and Transgender Women who have sex with Men.  Medication: Investigational Injectable Cabotegravir/placebo compared to Truvada/placebo. Duration: Around 4 years.  [name] is here for HPTN 083, week 19. He did have some pain with the last injection. Ist day no pain, 2nd pain at site and could not sleep on that side, and 3rd day it was gone. He denied any swelling, warmth, or drainage. HIV rapid is negative. He received $50 gift card. Next appointment scheduled for 07/24/2016. Frederick HeapElisha Hearl Heikes RN

## 2016-06-13 LAB — COMPREHENSIVE METABOLIC PANEL
ALBUMIN: 4.6 g/dL (ref 3.6–5.1)
ALT: 44 U/L (ref 9–46)
AST: 30 U/L (ref 10–40)
Alkaline Phosphatase: 72 U/L (ref 40–115)
BUN: 11 mg/dL (ref 7–25)
CHLORIDE: 104 mmol/L (ref 98–110)
CO2: 24 mmol/L (ref 20–31)
Calcium: 9.2 mg/dL (ref 8.6–10.3)
Creat: 0.88 mg/dL (ref 0.60–1.35)
Glucose, Bld: 106 mg/dL — ABNORMAL HIGH (ref 65–99)
POTASSIUM: 3.8 mmol/L (ref 3.5–5.3)
Sodium: 139 mmol/L (ref 135–146)
TOTAL PROTEIN: 7.4 g/dL (ref 6.1–8.1)
Total Bilirubin: 0.5 mg/dL (ref 0.2–1.2)

## 2016-06-13 LAB — LIPASE: LIPASE: 29 U/L (ref 7–60)

## 2016-06-13 LAB — CK: CK TOTAL: 310 U/L — AB (ref 7–232)

## 2016-06-13 LAB — AMYLASE: Amylase: 38 U/L (ref 0–105)

## 2016-06-13 LAB — PHOSPHORUS: Phosphorus: 3.9 mg/dL (ref 2.5–4.5)

## 2016-06-13 LAB — HIV ANTIBODY (ROUTINE TESTING W REFLEX): HIV: NONREACTIVE

## 2016-07-17 ENCOUNTER — Telehealth: Payer: Self-pay | Admitting: *Deleted

## 2016-07-17 DIAGNOSIS — A749 Chlamydial infection, unspecified: Secondary | ICD-10-CM

## 2016-07-17 MED ORDER — AZITHROMYCIN 500 MG PO TABS: 1000.0000 mg | ORAL_TABLET | Freq: Once | ORAL | 0 refills | Status: AC

## 2016-07-17 NOTE — Telephone Encounter (Signed)
[name] called stating he is positive for chlamydia but is unable to get to health department/clinic to get treatment because he is working late. Spoke with Dr. Daiva EvesVan Dam and he verbalized prescribing azithromycin 1000mg  once. Order called into [facility] in [place]. Contacted patient to inform him of prescription. Tacey HeapElisha Giani Winther RN

## 2016-07-24 ENCOUNTER — Encounter (INDEPENDENT_AMBULATORY_CARE_PROVIDER_SITE_OTHER): Payer: Self-pay | Admitting: *Deleted

## 2016-07-24 VITALS — BP 120/77 | HR 50 | Temp 97.8°F | Wt 171.8 lb

## 2016-07-24 DIAGNOSIS — Z006 Encounter for examination for normal comparison and control in clinical research program: Secondary | ICD-10-CM

## 2016-07-24 LAB — CBC WITH DIFFERENTIAL/PLATELET
Basophils Absolute: 43 cells/uL (ref 0–200)
Basophils Relative: 1 %
EOS ABS: 86 {cells}/uL (ref 15–500)
Eosinophils Relative: 2 %
HCT: 45.8 % (ref 38.5–50.0)
HEMOGLOBIN: 15.8 g/dL (ref 13.2–17.1)
LYMPHS ABS: 2021 {cells}/uL (ref 850–3900)
LYMPHS PCT: 47 %
MCH: 29.1 pg (ref 27.0–33.0)
MCHC: 34.5 g/dL (ref 32.0–36.0)
MCV: 84.3 fL (ref 80.0–100.0)
MONO ABS: 215 {cells}/uL (ref 200–950)
MPV: 10.7 fL (ref 7.5–12.5)
Monocytes Relative: 5 %
NEUTROS PCT: 45 %
Neutro Abs: 1935 cells/uL (ref 1500–7800)
Platelets: 238 10*3/uL (ref 140–400)
RBC: 5.43 MIL/uL (ref 4.20–5.80)
RDW: 14.2 % (ref 11.0–15.0)
WBC: 4.3 10*3/uL (ref 3.8–10.8)

## 2016-07-24 LAB — COMPREHENSIVE METABOLIC PANEL
ALT: 47 U/L — ABNORMAL HIGH (ref 9–46)
AST: 33 U/L (ref 10–40)
Albumin: 4.8 g/dL (ref 3.6–5.1)
Alkaline Phosphatase: 83 U/L (ref 40–115)
BUN: 13 mg/dL (ref 7–25)
CHLORIDE: 102 mmol/L (ref 98–110)
CO2: 27 mmol/L (ref 20–31)
CREATININE: 0.98 mg/dL (ref 0.60–1.35)
Calcium: 9.7 mg/dL (ref 8.6–10.3)
Glucose, Bld: 82 mg/dL (ref 65–99)
POTASSIUM: 4.2 mmol/L (ref 3.5–5.3)
SODIUM: 137 mmol/L (ref 135–146)
Total Bilirubin: 0.7 mg/dL (ref 0.2–1.2)
Total Protein: 7.2 g/dL (ref 6.1–8.1)

## 2016-07-24 LAB — HIV ANTIBODY (ROUTINE TESTING W REFLEX): HIV: NONREACTIVE

## 2016-07-24 LAB — LIPASE: LIPASE: 34 U/L (ref 7–60)

## 2016-07-24 LAB — PHOSPHORUS: Phosphorus: 3.4 mg/dL (ref 2.5–4.5)

## 2016-07-24 LAB — CK: Total CK: 261 U/L — ABNORMAL HIGH (ref 7–232)

## 2016-07-24 LAB — AMYLASE: AMYLASE: 45 U/L (ref 0–105)

## 2016-07-24 NOTE — Progress Notes (Signed)
Study: A Phase 2b/3 Double Blind Safety and Efficacy Study of Injectable Cabotegravir compared to Daily Oral Tenofovir Disoproxil Fumarate/Emtricitabine (TDF/FTC), For Pre-Exposure Prophylaxis in HIV-Uninfected Cisgender Men and Transgender Women who have sex with Men.  Medication: Investigational Injectable Cabotegravir/placebo compared to Truvada/placebo. Duration: Around 4 years.   [name] here for his week 25 visit. Verbalized that he has been "dealling with a lot" over the last few weeks. Feeling down and depressed. Offered him support and counseling. He stated that he did not wish to talk about things at this time. I did let him know that we had counselors here on site. He agreed to call for an appointment if needed. Next study visit is scheduled for 9/5 @ 4:15pm.

## 2016-08-06 ENCOUNTER — Encounter (INDEPENDENT_AMBULATORY_CARE_PROVIDER_SITE_OTHER): Payer: Self-pay | Admitting: *Deleted

## 2016-08-06 ENCOUNTER — Telehealth: Payer: Self-pay

## 2016-08-06 VITALS — BP 125/80 | HR 50 | Temp 97.9°F | Wt 172.5 lb

## 2016-08-06 DIAGNOSIS — Z006 Encounter for examination for normal comparison and control in clinical research program: Secondary | ICD-10-CM

## 2016-08-06 LAB — CBC WITH DIFFERENTIAL/PLATELET
BASOS PCT: 1 %
Basophils Absolute: 43 cells/uL (ref 0–200)
EOS ABS: 86 {cells}/uL (ref 15–500)
EOS PCT: 2 %
HCT: 43 % (ref 38.5–50.0)
Hemoglobin: 14.9 g/dL (ref 13.2–17.1)
Lymphocytes Relative: 51 %
Lymphs Abs: 2193 cells/uL (ref 850–3900)
MCH: 29.1 pg (ref 27.0–33.0)
MCHC: 34.7 g/dL (ref 32.0–36.0)
MCV: 84 fL (ref 80.0–100.0)
MONOS PCT: 6 %
MPV: 10.2 fL (ref 7.5–12.5)
Monocytes Absolute: 258 cells/uL (ref 200–950)
NEUTROS ABS: 1720 {cells}/uL (ref 1500–7800)
Neutrophils Relative %: 40 %
PLATELETS: 196 10*3/uL (ref 140–400)
RBC: 5.12 MIL/uL (ref 4.20–5.80)
RDW: 13.6 % (ref 11.0–15.0)
WBC: 4.3 10*3/uL (ref 3.8–10.8)

## 2016-08-06 LAB — COMPREHENSIVE METABOLIC PANEL
ALK PHOS: 91 U/L (ref 40–115)
ALT: 34 U/L (ref 9–46)
AST: 32 U/L (ref 10–40)
Albumin: 4.8 g/dL (ref 3.6–5.1)
BILIRUBIN TOTAL: 0.6 mg/dL (ref 0.2–1.2)
BUN: 8 mg/dL (ref 7–25)
CO2: 23 mmol/L (ref 20–31)
CREATININE: 0.9 mg/dL (ref 0.60–1.35)
Calcium: 9.3 mg/dL (ref 8.6–10.3)
Chloride: 103 mmol/L (ref 98–110)
GLUCOSE: 83 mg/dL (ref 65–99)
Potassium: 3.7 mmol/L (ref 3.5–5.3)
SODIUM: 137 mmol/L (ref 135–146)
TOTAL PROTEIN: 7.3 g/dL (ref 6.1–8.1)

## 2016-08-06 LAB — PHOSPHORUS: PHOSPHORUS: 3.4 mg/dL (ref 2.5–4.5)

## 2016-08-06 LAB — LIPASE: LIPASE: 46 U/L (ref 7–60)

## 2016-08-06 LAB — CK: CK TOTAL: 330 U/L — AB (ref 7–232)

## 2016-08-06 LAB — AMYLASE: AMYLASE: 40 U/L (ref 0–105)

## 2016-08-07 LAB — HIV ANTIBODY (ROUTINE TESTING W REFLEX): HIV: NONREACTIVE

## 2016-08-07 NOTE — Progress Notes (Signed)
Study: A Phase 2b/3 Double Blind Safety and Efficacy Study of Injectable Cabotegravir compared to Daily Oral Tenofovir Disoproxil Fumarate/Emtricitabine (TDF/FTC), For Pre-Exposure Prophylaxis in HIV-Uninfected Cisgender Men and Transgender Women who have sex with Men.  Medication: Investigational Injectable Cabotegravir/placebo compared to Truvada/placebo. Duration: Around 4 years.  [name] is here for week 27 visit. Verbalized only mild tenderness at the injection site for a couple of day post injection. No bruising, redness or swelling noted. No other complaints or concerns verbalized. Next visit scheduled for 10/18 @ 4:00pm.

## 2016-08-20 ENCOUNTER — Other Ambulatory Visit: Payer: Self-pay | Admitting: Medical

## 2016-08-20 ENCOUNTER — Ambulatory Visit
Admission: RE | Admit: 2016-08-20 | Discharge: 2016-08-20 | Disposition: A | Discharge status: Home / Self Care | Payer: BLUE CROSS/BLUE SHIELD | Source: Ambulatory Visit | Attending: Medical | Admitting: Medical

## 2016-08-20 DIAGNOSIS — R0989 Other specified symptoms and signs involving the circulatory and respiratory systems: Secondary | ICD-10-CM

## 2016-09-18 ENCOUNTER — Encounter (INDEPENDENT_AMBULATORY_CARE_PROVIDER_SITE_OTHER): Payer: Self-pay | Admitting: *Deleted

## 2016-09-18 VITALS — BP 134/87 | HR 52 | Temp 98.2°F | Wt 172.0 lb

## 2016-09-18 DIAGNOSIS — Z006 Encounter for examination for normal comparison and control in clinical research program: Secondary | ICD-10-CM

## 2016-09-18 LAB — CBC WITH DIFFERENTIAL/PLATELET
BASOS ABS: 0 {cells}/uL (ref 0–200)
Basophils Relative: 0 %
EOS ABS: 90 {cells}/uL (ref 15–500)
Eosinophils Relative: 2 %
HEMATOCRIT: 44.6 % (ref 38.5–50.0)
Hemoglobin: 15.4 g/dL (ref 13.2–17.1)
LYMPHS PCT: 51 %
Lymphs Abs: 2295 cells/uL (ref 850–3900)
MCH: 29.4 pg (ref 27.0–33.0)
MCHC: 34.5 g/dL (ref 32.0–36.0)
MCV: 85.1 fL (ref 80.0–100.0)
MONOS PCT: 5 %
MPV: 10.2 fL (ref 7.5–12.5)
Monocytes Absolute: 225 cells/uL (ref 200–950)
NEUTROS ABS: 1890 {cells}/uL (ref 1500–7800)
Neutrophils Relative %: 42 %
PLATELETS: 229 10*3/uL (ref 140–400)
RBC: 5.24 MIL/uL (ref 4.20–5.80)
RDW: 13.9 % (ref 11.0–15.0)
WBC: 4.5 10*3/uL (ref 3.8–10.8)

## 2016-09-19 LAB — HIV ANTIBODY (ROUTINE TESTING W REFLEX): HIV: NONREACTIVE

## 2016-09-19 LAB — COMPREHENSIVE METABOLIC PANEL
ALBUMIN: 4.6 g/dL (ref 3.6–5.1)
ALK PHOS: 80 U/L (ref 40–115)
ALT: 33 U/L (ref 9–46)
AST: 24 U/L (ref 10–40)
BUN: 11 mg/dL (ref 7–25)
CALCIUM: 9.2 mg/dL (ref 8.6–10.3)
CO2: 22 mmol/L (ref 20–31)
Chloride: 105 mmol/L (ref 98–110)
Creat: 0.88 mg/dL (ref 0.60–1.35)
GLUCOSE: 84 mg/dL (ref 65–99)
POTASSIUM: 3.9 mmol/L (ref 3.5–5.3)
Sodium: 140 mmol/L (ref 135–146)
Total Bilirubin: 0.6 mg/dL (ref 0.2–1.2)
Total Protein: 7.2 g/dL (ref 6.1–8.1)

## 2016-09-19 LAB — LIPASE: LIPASE: 32 U/L (ref 7–60)

## 2016-09-19 LAB — CK: Total CK: 152 U/L (ref 7–232)

## 2016-09-19 LAB — RPR

## 2016-09-19 LAB — AMYLASE: Amylase: 47 U/L (ref 0–105)

## 2016-09-19 LAB — GC/CHLAMYDIA PROBE AMP
CT Probe RNA: NOT DETECTED
GC PROBE AMP APTIMA: NOT DETECTED

## 2016-09-19 LAB — PHOSPHORUS: Phosphorus: 3.2 mg/dL (ref 2.5–4.5)

## 2016-09-19 NOTE — Progress Notes (Signed)
Study: A Phase 2b/3 Double Blind Safety and Efficacy Study of Injectable Cabotegravir compared to Daily Oral Tenofovir Disoproxil Fumarate/Emtricitabine (TDF/FTC), For Pre-Exposure Prophylaxis in HIV-Uninfected Cisgender Men and Transgender Women who have sex with Men.  Medication: Investigational Injectable Cabotegravir/placebo compared to Truvada/placebo. Duration: Around 4 years.  [name] is here for week 33. HIV rapid is negative. Assessment unremarkable. Did not return pills. Questionnaires completed and vitals stable. Oral study meds were dispensed and injection was administered into left gluteal muscle with no problems. $50 gift card given for visit and will see next Thursday. Tacey HeapElisha Sherman Lipuma RN

## 2016-09-21 LAB — CT/NG RNA, TMA RECTAL
Chlamydia Trachomatis RNA: NOT DETECTED
Neisseria Gonorrhoeae RNA: NOT DETECTED

## 2016-09-26 ENCOUNTER — Encounter (INDEPENDENT_AMBULATORY_CARE_PROVIDER_SITE_OTHER): Payer: Self-pay | Admitting: *Deleted

## 2016-09-26 VITALS — BP 143/89 | HR 58 | Temp 97.6°F | Wt 173.0 lb

## 2016-09-26 DIAGNOSIS — Z006 Encounter for examination for normal comparison and control in clinical research program: Secondary | ICD-10-CM

## 2016-09-26 LAB — CBC WITH DIFFERENTIAL/PLATELET
BASOS ABS: 0 {cells}/uL (ref 0–200)
BASOS PCT: 0 %
EOS PCT: 2 %
Eosinophils Absolute: 90 cells/uL (ref 15–500)
HCT: 42.9 % (ref 38.5–50.0)
Hemoglobin: 14.5 g/dL (ref 13.2–17.1)
Lymphocytes Relative: 47 %
Lymphs Abs: 2115 cells/uL (ref 850–3900)
MCH: 29 pg (ref 27.0–33.0)
MCHC: 33.8 g/dL (ref 32.0–36.0)
MCV: 85.8 fL (ref 80.0–100.0)
MONOS PCT: 6 %
MPV: 9.9 fL (ref 7.5–12.5)
Monocytes Absolute: 270 cells/uL (ref 200–950)
NEUTROS ABS: 2025 {cells}/uL (ref 1500–7800)
Neutrophils Relative %: 45 %
PLATELETS: 245 10*3/uL (ref 140–400)
RBC: 5 MIL/uL (ref 4.20–5.80)
RDW: 13.2 % (ref 11.0–15.0)
WBC: 4.5 10*3/uL (ref 3.8–10.8)

## 2016-09-27 LAB — HIV ANTIBODY (ROUTINE TESTING W REFLEX): HIV 1&2 Ab, 4th Generation: NONREACTIVE

## 2016-09-27 LAB — COMPREHENSIVE METABOLIC PANEL
ALBUMIN: 4.5 g/dL (ref 3.6–5.1)
ALK PHOS: 87 U/L (ref 40–115)
ALT: 30 U/L (ref 9–46)
AST: 23 U/L (ref 10–40)
BILIRUBIN TOTAL: 0.4 mg/dL (ref 0.2–1.2)
BUN: 13 mg/dL (ref 7–25)
CALCIUM: 9.4 mg/dL (ref 8.6–10.3)
CO2: 24 mmol/L (ref 20–31)
Chloride: 106 mmol/L (ref 98–110)
Creat: 1.15 mg/dL (ref 0.60–1.35)
Glucose, Bld: 100 mg/dL — ABNORMAL HIGH (ref 65–99)
POTASSIUM: 3.6 mmol/L (ref 3.5–5.3)
Sodium: 139 mmol/L (ref 135–146)
TOTAL PROTEIN: 7.1 g/dL (ref 6.1–8.1)

## 2016-09-27 LAB — AMYLASE: Amylase: 41 U/L (ref 0–105)

## 2016-09-27 LAB — CK: CK TOTAL: 198 U/L (ref 7–232)

## 2016-09-27 LAB — LIPASE: Lipase: 34 U/L (ref 7–60)

## 2016-09-27 LAB — PHOSPHORUS: PHOSPHORUS: 3.4 mg/dL (ref 2.5–4.5)
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
## 2016-10-06 NOTE — Progress Notes (Signed)
Followed up with [name] re wk 35 patient visit. Advises injections and study visits are going great. [name] advised that he tried reaching out to one of the community partners to be tested, but to no avail. Advised he will be leaving for vacation to celebrate with family. Discussed adherence and what his plans were to be adherent while vacationing.

## 2016-11-13 ENCOUNTER — Encounter (INDEPENDENT_AMBULATORY_CARE_PROVIDER_SITE_OTHER): Payer: Self-pay | Admitting: *Deleted

## 2016-11-13 VITALS — BP 121/78 | HR 52 | Temp 98.0°F | Wt 172.0 lb

## 2016-11-13 DIAGNOSIS — Z006 Encounter for examination for normal comparison and control in clinical research program: Secondary | ICD-10-CM

## 2016-11-13 LAB — CBC WITH DIFFERENTIAL/PLATELET
Basophils Absolute: 41 cells/uL (ref 0–200)
Basophils Relative: 1 %
EOS PCT: 2 %
Eosinophils Absolute: 82 cells/uL (ref 15–500)
HEMATOCRIT: 44.8 % (ref 38.5–50.0)
HEMOGLOBIN: 15.1 g/dL (ref 13.2–17.1)
LYMPHS ABS: 1763 {cells}/uL (ref 850–3900)
Lymphocytes Relative: 43 %
MCH: 28.3 pg (ref 27.0–33.0)
MCHC: 33.7 g/dL (ref 32.0–36.0)
MCV: 84.1 fL (ref 80.0–100.0)
MONO ABS: 246 {cells}/uL (ref 200–950)
MPV: 10.6 fL (ref 7.5–12.5)
Monocytes Relative: 6 %
NEUTROS ABS: 1968 {cells}/uL (ref 1500–7800)
NEUTROS PCT: 48 %
Platelets: 206 10*3/uL (ref 140–400)
RBC: 5.33 MIL/uL (ref 4.20–5.80)
RDW: 14.2 % (ref 11.0–15.0)
WBC: 4.1 10*3/uL (ref 3.8–10.8)

## 2016-11-13 NOTE — Progress Notes (Signed)
Study: A Phase 2b/3 Double Blind Safety and Efficacy Study of Injectable Cabotegravir compared to Daily Oral Tenofovir Disoproxil Fumarate/Emtricitabine (TDF/FTC), For Pre-Exposure Prophylaxis in HIV-Uninfected Cisgender Men and Transgender Women who have sex with Men.  Medication: Investigational Injectable Cabotegravir/placebo compared to Truvada/placebo. Duration: Around 4 years.  [name] here for week 41 visit. No new complaints verbalized. Did not return pills. HIV/risk reduction counseling provided. Condoms and lube provided. Rapid HIV non-reactive. I did answer/clarify questions regarding study and lab requirements. Verbalized understanding. Cabotegravir/placebo injection given (R) gluteal muscle. Site unremarkable. Next visit scheduled for 12/27 @ 2:00pm.

## 2016-11-14 LAB — COMPREHENSIVE METABOLIC PANEL
ALBUMIN: 4.6 g/dL (ref 3.6–5.1)
ALK PHOS: 71 U/L (ref 40–115)
ALT: 39 U/L (ref 9–46)
AST: 29 U/L (ref 10–40)
BILIRUBIN TOTAL: 0.8 mg/dL (ref 0.2–1.2)
BUN: 14 mg/dL (ref 7–25)
CHLORIDE: 104 mmol/L (ref 98–110)
CO2: 27 mmol/L (ref 20–31)
CREATININE: 1.04 mg/dL (ref 0.60–1.35)
Calcium: 9.1 mg/dL (ref 8.6–10.3)
Glucose, Bld: 86 mg/dL (ref 65–99)
Potassium: 3.9 mmol/L (ref 3.5–5.3)
SODIUM: 140 mmol/L (ref 135–146)
TOTAL PROTEIN: 7 g/dL (ref 6.1–8.1)

## 2016-11-14 LAB — AMYLASE: Amylase: 42 U/L (ref 0–105)

## 2016-11-14 LAB — LIPASE: Lipase: 31 U/L (ref 7–60)

## 2016-11-14 LAB — HIV ANTIBODY (ROUTINE TESTING W REFLEX): HIV: NONREACTIVE

## 2016-11-14 LAB — CK: CK TOTAL: 199 U/L (ref 7–232)

## 2016-11-14 LAB — PHOSPHORUS: PHOSPHORUS: 3.6 mg/dL (ref 2.5–4.5)

## 2016-11-27 ENCOUNTER — Encounter (INDEPENDENT_AMBULATORY_CARE_PROVIDER_SITE_OTHER): Payer: BLUE CROSS/BLUE SHIELD | Admitting: *Deleted

## 2016-11-27 VITALS — BP 135/84 | HR 57 | Temp 97.8°F | Wt 174.8 lb

## 2016-11-27 DIAGNOSIS — Z006 Encounter for examination for normal comparison and control in clinical research program: Secondary | ICD-10-CM

## 2016-11-27 LAB — CBC WITH DIFFERENTIAL/PLATELET
BASOS PCT: 0 %
Basophils Absolute: 0 cells/uL (ref 0–200)
EOS ABS: 43 {cells}/uL (ref 15–500)
Eosinophils Relative: 1 %
HEMATOCRIT: 43.8 % (ref 38.5–50.0)
Hemoglobin: 14.9 g/dL (ref 13.2–17.1)
LYMPHS ABS: 1720 {cells}/uL (ref 850–3900)
Lymphocytes Relative: 40 %
MCH: 28.5 pg (ref 27.0–33.0)
MCHC: 34 g/dL (ref 32.0–36.0)
MCV: 83.7 fL (ref 80.0–100.0)
MONO ABS: 129 {cells}/uL — AB (ref 200–950)
MPV: 9.9 fL (ref 7.5–12.5)
Monocytes Relative: 3 %
NEUTROS ABS: 2408 {cells}/uL (ref 1500–7800)
Neutrophils Relative %: 56 %
Platelets: 232 10*3/uL (ref 140–400)
RBC: 5.23 MIL/uL (ref 4.20–5.80)
RDW: 14 % (ref 11.0–15.0)
WBC: 4.3 10*3/uL (ref 3.8–10.8)

## 2016-11-27 NOTE — Progress Notes (Signed)
Study: A Phase 2b/3 Double Blind Safety and Efficacy Study of Injectable Cabotegravir compared to Daily Oral Tenofovir Disoproxil Fumarate/Emtricitabine (TDF/FTC), For Pre-Exposure Prophylaxis in HIV-Uninfected Cisgender Men and Transgender Women who have sex with Men.  Medication: Investigational Injectable Cabotegravir/placebo compared to Truvada/placebo. Duration: Around 4 years.  [name] is here for week 43. He reported moderate injection site tenderness for a few days. Did take Advil for relief. Questionnaire completed. Blood drawn with no problems. Vitals are stable. He received $50 gift card for visit and received condoms/lube. Next appointment scheduled for 01/08/2017 @ 4pm. Tacey HeapElisha Hadasah Brugger RN

## 2016-11-28 LAB — COMPREHENSIVE METABOLIC PANEL
ALBUMIN: 4.3 g/dL (ref 3.6–5.1)
ALK PHOS: 82 U/L (ref 40–115)
ALT: 35 U/L (ref 9–46)
AST: 28 U/L (ref 10–40)
BILIRUBIN TOTAL: 0.7 mg/dL (ref 0.2–1.2)
BUN: 16 mg/dL (ref 7–25)
CALCIUM: 9 mg/dL (ref 8.6–10.3)
CO2: 24 mmol/L (ref 20–31)
CREATININE: 0.94 mg/dL (ref 0.60–1.35)
Chloride: 103 mmol/L (ref 98–110)
GLUCOSE: 136 mg/dL — AB (ref 65–99)
Potassium: 3.7 mmol/L (ref 3.5–5.3)
SODIUM: 140 mmol/L (ref 135–146)
Total Protein: 6.7 g/dL (ref 6.1–8.1)

## 2016-11-28 LAB — CK: Total CK: 203 U/L (ref 7–232)

## 2016-11-28 LAB — AMYLASE: Amylase: 33 U/L (ref 0–105)

## 2016-11-28 LAB — PHOSPHORUS: PHOSPHORUS: 3.4 mg/dL (ref 2.5–4.5)

## 2016-11-28 LAB — HIV ANTIBODY (ROUTINE TESTING W REFLEX): HIV 1&2 Ab, 4th Generation: NONREACTIVE

## 2016-11-28 LAB — LIPASE: LIPASE: 38 U/L (ref 7–60)

## 2017-01-08 ENCOUNTER — Encounter (INDEPENDENT_AMBULATORY_CARE_PROVIDER_SITE_OTHER): Payer: Self-pay | Admitting: *Deleted

## 2017-01-08 VITALS — BP 121/74 | HR 56 | Temp 98.1°F | Wt 174.0 lb

## 2017-01-08 DIAGNOSIS — Z006 Encounter for examination for normal comparison and control in clinical research program: Secondary | ICD-10-CM

## 2017-01-08 LAB — COMPREHENSIVE METABOLIC PANEL
ALBUMIN: 4.7 g/dL (ref 3.6–5.1)
ALT: 37 U/L (ref 9–46)
AST: 33 U/L (ref 10–40)
Alkaline Phosphatase: 79 U/L (ref 40–115)
BUN: 13 mg/dL (ref 7–25)
CALCIUM: 8.9 mg/dL (ref 8.6–10.3)
CHLORIDE: 103 mmol/L (ref 98–110)
CO2: 26 mmol/L (ref 20–31)
Creat: 1.11 mg/dL (ref 0.60–1.35)
Glucose, Bld: 93 mg/dL (ref 65–99)
POTASSIUM: 4.3 mmol/L (ref 3.5–5.3)
Sodium: 140 mmol/L (ref 135–146)
TOTAL PROTEIN: 7.3 g/dL (ref 6.1–8.1)
Total Bilirubin: 0.8 mg/dL (ref 0.2–1.2)

## 2017-01-08 LAB — CBC WITH DIFFERENTIAL/PLATELET
BASOS PCT: 1 %
Basophils Absolute: 44 cells/uL (ref 0–200)
EOS PCT: 2 %
Eosinophils Absolute: 88 cells/uL (ref 15–500)
HEMATOCRIT: 44.2 % (ref 38.5–50.0)
HEMOGLOBIN: 15.4 g/dL (ref 13.2–17.1)
LYMPHS ABS: 2068 {cells}/uL (ref 850–3900)
Lymphocytes Relative: 47 %
MCH: 29 pg (ref 27.0–33.0)
MCHC: 34.8 g/dL (ref 32.0–36.0)
MCV: 83.2 fL (ref 80.0–100.0)
MONO ABS: 176 {cells}/uL — AB (ref 200–950)
MPV: 10.2 fL (ref 7.5–12.5)
Monocytes Relative: 4 %
NEUTROS ABS: 2024 {cells}/uL (ref 1500–7800)
Neutrophils Relative %: 46 %
Platelets: 211 10*3/uL (ref 140–400)
RBC: 5.31 MIL/uL (ref 4.20–5.80)
RDW: 14.2 % (ref 11.0–15.0)
WBC: 4.4 10*3/uL (ref 3.8–10.8)

## 2017-01-08 LAB — PHOSPHORUS: Phosphorus: 3.7 mg/dL (ref 2.5–4.5)

## 2017-01-08 NOTE — Progress Notes (Signed)
**Note Frederick-Identified via Obfuscation** Study: A Phase 2b/3 Double Blind Safety and Efficacy Study of Injectable Cabotegravir compared to Daily Oral Tenofovir Disoproxil Fumarate/Emtricitabine (TDF/FTC), For Pre-Exposure Prophylaxis in HIV-Uninfected Cisgender Men and Transgender Women who have sex with Men.  Medication: Investigational Injectable Cabotegravir/placebo compared to Truvada/placebo. Duration: Around 4 years.  Frederick Hampton is here for week 49 visit. No new complaints or concerns verbalized. Rapid HIV non-reactive. Cabotegravir/placebo injection given (L) gluteal muscle. Site unremarkable.Refused condoms and lube. Next visit scheduled for 2/21 @ 4:15pm

## 2017-01-09 LAB — LIPASE: LIPASE: 31 U/L (ref 7–60)

## 2017-01-09 LAB — HIV ANTIBODY (ROUTINE TESTING W REFLEX): HIV: NONREACTIVE

## 2017-01-09 LAB — CK: CK TOTAL: 244 U/L — AB (ref 7–232)

## 2017-01-09 LAB — AMYLASE: AMYLASE: 42 U/L (ref 0–105)

## 2017-01-22 ENCOUNTER — Encounter (INDEPENDENT_AMBULATORY_CARE_PROVIDER_SITE_OTHER): Payer: Self-pay | Admitting: *Deleted

## 2017-01-22 VITALS — BP 124/84 | HR 57 | Temp 97.9°F | Wt 173.2 lb

## 2017-01-22 DIAGNOSIS — Z006 Encounter for examination for normal comparison and control in clinical research program: Secondary | ICD-10-CM

## 2017-01-22 LAB — CBC WITH DIFFERENTIAL/PLATELET
BASOS ABS: 0 {cells}/uL (ref 0–200)
BASOS PCT: 0 %
EOS ABS: 90 {cells}/uL (ref 15–500)
Eosinophils Relative: 2 %
HCT: 42.5 % (ref 38.5–50.0)
HEMOGLOBIN: 14.6 g/dL (ref 13.2–17.1)
Lymphocytes Relative: 47 %
Lymphs Abs: 2115 cells/uL (ref 850–3900)
MCH: 28.7 pg (ref 27.0–33.0)
MCHC: 34.4 g/dL (ref 32.0–36.0)
MCV: 83.5 fL (ref 80.0–100.0)
MONO ABS: 225 {cells}/uL (ref 200–950)
MONOS PCT: 5 %
MPV: 10 fL (ref 7.5–12.5)
NEUTROS ABS: 2070 {cells}/uL (ref 1500–7800)
Neutrophils Relative %: 46 %
PLATELETS: 223 10*3/uL (ref 140–400)
RBC: 5.09 MIL/uL (ref 4.20–5.80)
RDW: 14.3 % (ref 11.0–15.0)
WBC: 4.5 10*3/uL (ref 3.8–10.8)

## 2017-01-22 LAB — COMPREHENSIVE METABOLIC PANEL
ALBUMIN: 4.5 g/dL (ref 3.6–5.1)
ALT: 33 U/L (ref 9–46)
AST: 26 U/L (ref 10–40)
Alkaline Phosphatase: 81 U/L (ref 40–115)
BUN: 12 mg/dL (ref 7–25)
CALCIUM: 9 mg/dL (ref 8.6–10.3)
CHLORIDE: 105 mmol/L (ref 98–110)
CO2: 26 mmol/L (ref 20–31)
Creat: 1.09 mg/dL (ref 0.60–1.35)
GLUCOSE: 90 mg/dL (ref 65–99)
POTASSIUM: 4 mmol/L (ref 3.5–5.3)
Sodium: 141 mmol/L (ref 135–146)
Total Bilirubin: 0.7 mg/dL (ref 0.2–1.2)
Total Protein: 7.1 g/dL (ref 6.1–8.1)

## 2017-01-22 LAB — PHOSPHORUS: PHOSPHORUS: 3.3 mg/dL (ref 2.5–4.5)

## 2017-01-22 NOTE — Progress Notes (Signed)
Study: A Phase 2b/3 Double Blind Safety and Efficacy Study of Injectable Cabotegravir compared to Daily Oral Tenofovir Disoproxil Fumarate/Emtricitabine (TDF/FTC), For Pre-Exposure Prophylaxis in HIV-Uninfected Cisgender Men and Transgender Women who have sex with Men.  Medication: Investigational Injectable Cabotegravir/placebo compared to Truvada/placebo. Duration: Around 4 years.   [name] is here for week 51 visit. Reports mild tenderness to touch and generalized body aches post injection which lasted a couple of days. Did take Tylenol x 1 which he states did help with ISR. No other complaints verbalized. Rapid HIV non-reactive. Verbalized excellent adherence with his oral study medication. No missed doses. He will return in April for his next injection visit.

## 2017-01-23 LAB — AMYLASE: Amylase: 43 U/L (ref 0–105)

## 2017-01-23 LAB — HIV ANTIBODY (ROUTINE TESTING W REFLEX): HIV 1&2 Ab, 4th Generation: NONREACTIVE

## 2017-01-23 LAB — LIPASE: Lipase: 31 U/L (ref 7–60)

## 2017-01-23 LAB — CK: CK TOTAL: 350 U/L — AB (ref 7–232)

## 2017-03-03 ENCOUNTER — Encounter (INDEPENDENT_AMBULATORY_CARE_PROVIDER_SITE_OTHER): Payer: Self-pay | Admitting: *Deleted

## 2017-03-03 VITALS — BP 121/80 | HR 55 | Temp 97.7°F | Wt 174.8 lb

## 2017-03-03 DIAGNOSIS — Z006 Encounter for examination for normal comparison and control in clinical research program: Secondary | ICD-10-CM

## 2017-03-03 DIAGNOSIS — B379 Candidiasis, unspecified: Secondary | ICD-10-CM

## 2017-03-03 LAB — LIPID PANEL
CHOL/HDL RATIO: 7.1 ratio — AB (ref <5.0)
Cholesterol: 198 mg/dL (ref <200)
HDL: 28 mg/dL — AB (ref >40)
LDL CALC: 105 mg/dL — AB (ref <100)
Triglycerides: 327 mg/dL — ABNORMAL HIGH (ref <150)
VLDL: 65 mg/dL — AB (ref <30)

## 2017-03-03 LAB — HEPATITIS C ANTIBODY: HCV Ab: NEGATIVE

## 2017-03-03 LAB — CBC WITH DIFFERENTIAL/PLATELET
BASOS PCT: 1 %
Basophils Absolute: 42 cells/uL (ref 0–200)
EOS PCT: 2 %
Eosinophils Absolute: 84 cells/uL (ref 15–500)
HCT: 43.8 % (ref 38.5–50.0)
Hemoglobin: 14.9 g/dL (ref 13.2–17.1)
LYMPHS PCT: 41 %
Lymphs Abs: 1722 cells/uL (ref 850–3900)
MCH: 29.3 pg (ref 27.0–33.0)
MCHC: 34 g/dL (ref 32.0–36.0)
MCV: 86.2 fL (ref 80.0–100.0)
MONOS PCT: 6 %
MPV: 10.6 fL (ref 7.5–12.5)
Monocytes Absolute: 252 cells/uL (ref 200–950)
Neutro Abs: 2100 cells/uL (ref 1500–7800)
Neutrophils Relative %: 50 %
PLATELETS: 204 10*3/uL (ref 140–400)
RBC: 5.08 MIL/uL (ref 4.20–5.80)
RDW: 14.1 % (ref 11.0–15.0)
WBC: 4.2 10*3/uL (ref 3.8–10.8)

## 2017-03-03 LAB — COMPREHENSIVE METABOLIC PANEL
ALK PHOS: 74 U/L (ref 40–115)
ALT: 33 U/L (ref 9–46)
AST: 24 U/L (ref 10–40)
Albumin: 4.4 g/dL (ref 3.6–5.1)
BILIRUBIN TOTAL: 0.7 mg/dL (ref 0.2–1.2)
BUN: 19 mg/dL (ref 7–25)
CO2: 27 mmol/L (ref 20–31)
Calcium: 9 mg/dL (ref 8.6–10.3)
Chloride: 105 mmol/L (ref 98–110)
Creat: 1.14 mg/dL (ref 0.60–1.35)
GLUCOSE: 96 mg/dL (ref 65–99)
Potassium: 3.8 mmol/L (ref 3.5–5.3)
Sodium: 138 mmol/L (ref 135–146)
Total Protein: 7.1 g/dL (ref 6.1–8.1)

## 2017-03-03 LAB — LIPASE: Lipase: 42 U/L (ref 7–60)

## 2017-03-03 LAB — AMYLASE: Amylase: 43 U/L (ref 0–105)

## 2017-03-03 LAB — HIV ANTIBODY (ROUTINE TESTING W REFLEX): HIV: NONREACTIVE

## 2017-03-03 LAB — CK: Total CK: 232 U/L (ref 7–232)

## 2017-03-03 LAB — PHOSPHORUS: Phosphorus: 3.1 mg/dL (ref 2.5–4.5)

## 2017-03-03 MED ORDER — FLUCONAZOLE 150 MG PO TABS: 150.0000 mg | ORAL_TABLET | Freq: Once | ORAL | 0 refills | Status: AC

## 2017-03-03 NOTE — Progress Notes (Signed)
Study: A Phase 2b/3 Double Blind Safety and Efficacy Study of Injectable Cabotegravir compared to Daily Oral Tenofovir Disoproxil Fumarate/Emtricitabine (TDF/FTC), For Pre-Exposure Prophylaxis in HIV-Uninfected Cisgender Men and Transgender Women who have sex with Men.  Medication: Investigational Injectable Cabotegravir/placebo compared to Truvada/placebo. Duration: Around 4 years.  Frederick Hampton is here for week 57 visit. Complaining of recurrent penile yeast infection. States that it's a seasonal problem that occurs at least once a year when the temperature starts getting warmer. Has been applying clotrimazole/betamethasone cream to affected area. States it helps but symptoms return once he stops using cream. States that he was given diflucan in the past which helped. Discussed with Dr Daiva Eves and called in Diflucan 150 mg single dose to [facility] in [place]. Instructed to notify us if symptoms do not improve or if they become worse. Verbalized agreement.  No other concerns verbalized. Rapid HIV non-reactive. Cabotegravir/placebo injection given (L) gluteal muscle. Site unremarkable. Next visit scheduled for 4/18 @ 4:15pm.

## 2017-03-04 LAB — GC/CHLAMYDIA PROBE AMP
CT Probe RNA: NOT DETECTED
GC PROBE AMP APTIMA: NOT DETECTED

## 2017-03-04 LAB — URINALYSIS, ROUTINE W REFLEX MICROSCOPIC
Bilirubin Urine: NEGATIVE
GLUCOSE, UA: NEGATIVE
HGB URINE DIPSTICK: NEGATIVE
Ketones, ur: NEGATIVE
Leukocytes, UA: NEGATIVE
Nitrite: NEGATIVE
Protein, ur: NEGATIVE
Specific Gravity, Urine: 1.014 (ref 1.001–1.035)
pH: 5.5 (ref 5.0–8.0)

## 2017-03-04 LAB — RPR

## 2017-03-06 LAB — CT/NG RNA, TMA RECTAL
Chlamydia Trachomatis RNA: NOT DETECTED
Neisseria Gonorrhoeae RNA: NOT DETECTED

## 2017-03-19 ENCOUNTER — Encounter (INDEPENDENT_AMBULATORY_CARE_PROVIDER_SITE_OTHER): Payer: Self-pay | Admitting: *Deleted

## 2017-03-19 VITALS — BP 125/81 | HR 50 | Temp 97.9°F | Wt 173.8 lb

## 2017-03-19 DIAGNOSIS — Z006 Encounter for examination for normal comparison and control in clinical research program: Secondary | ICD-10-CM

## 2017-03-19 LAB — CBC WITH DIFFERENTIAL/PLATELET
BASOS PCT: 0 %
Basophils Absolute: 0 cells/uL (ref 0–200)
EOS ABS: 144 {cells}/uL (ref 15–500)
Eosinophils Relative: 3 %
HCT: 43.1 % (ref 38.5–50.0)
Hemoglobin: 14.8 g/dL (ref 13.2–17.1)
Lymphocytes Relative: 42 %
Lymphs Abs: 2016 cells/uL (ref 850–3900)
MCH: 29.1 pg (ref 27.0–33.0)
MCHC: 34.3 g/dL (ref 32.0–36.0)
MCV: 84.7 fL (ref 80.0–100.0)
MONO ABS: 288 {cells}/uL (ref 200–950)
MONOS PCT: 6 %
MPV: 9.9 fL (ref 7.5–12.5)
NEUTROS ABS: 2352 {cells}/uL (ref 1500–7800)
Neutrophils Relative %: 49 %
PLATELETS: 233 10*3/uL (ref 140–400)
RBC: 5.09 MIL/uL (ref 4.20–5.80)
RDW: 13.8 % (ref 11.0–15.0)
WBC: 4.8 10*3/uL (ref 3.8–10.8)

## 2017-03-19 LAB — COMPREHENSIVE METABOLIC PANEL
ALBUMIN: 4.8 g/dL (ref 3.6–5.1)
ALT: 38 U/L (ref 9–46)
AST: 30 U/L (ref 10–40)
Alkaline Phosphatase: 87 U/L (ref 40–115)
BILIRUBIN TOTAL: 0.6 mg/dL (ref 0.2–1.2)
BUN: 13 mg/dL (ref 7–25)
CALCIUM: 9.6 mg/dL (ref 8.6–10.3)
CO2: 25 mmol/L (ref 20–31)
Chloride: 102 mmol/L (ref 98–110)
Creat: 1.07 mg/dL (ref 0.60–1.35)
Glucose, Bld: 99 mg/dL (ref 65–99)
Potassium: 3.8 mmol/L (ref 3.5–5.3)
Sodium: 139 mmol/L (ref 135–146)
Total Protein: 7.4 g/dL (ref 6.1–8.1)

## 2017-03-19 LAB — PHOSPHORUS: Phosphorus: 3.4 mg/dL (ref 2.5–4.5)

## 2017-03-19 NOTE — Progress Notes (Signed)
Study: A Phase 2b/3 Double Blind Safety and Efficacy Study of Injectable Cabotegravir compared to Daily Oral Tenofovir Disoproxil Fumarate/Emtricitabine (TDF/FTC), For Pre-Exposure Prophylaxis in HIV-Uninfected Cisgender Men and Transgender Women who have sex with Men.  Medication: Investigational Injectable Cabotegravir/placebo compared to Truvada/placebo. Duration: Around 4 years.   Frederick Hampton is here for week 59 visit. States mild generalized body aches and tenderness at site after last injection which only lasted a couple of days. No other complaints or concerns verbalized. States that he has been very adherent with his medication. No missed doses since his last visit. Rapid HIV non-reactive. Declined condoms/lube. Reviewed labs from previous visit. Verbalized concern about increase triglycerides. We discussed importance of diet and exercise. He will return in May for his next injection visit.

## 2017-03-20 LAB — HIV ANTIBODY (ROUTINE TESTING W REFLEX): HIV 1&2 Ab, 4th Generation: NONREACTIVE

## 2017-03-20 LAB — CK: CK TOTAL: 218 U/L (ref 7–232)

## 2017-03-20 LAB — LIPASE: Lipase: 45 U/L (ref 7–60)

## 2017-03-20 LAB — AMYLASE: AMYLASE: 47 U/L (ref 0–105)

## 2017-05-06 ENCOUNTER — Encounter (INDEPENDENT_AMBULATORY_CARE_PROVIDER_SITE_OTHER): Payer: BLUE CROSS/BLUE SHIELD | Admitting: *Deleted

## 2017-05-06 VITALS — BP 129/83 | HR 55 | Temp 98.0°F | Wt 173.0 lb

## 2017-05-06 DIAGNOSIS — Z006 Encounter for examination for normal comparison and control in clinical research program: Secondary | ICD-10-CM

## 2017-05-06 LAB — CBC WITH DIFFERENTIAL/PLATELET
BASOS PCT: 1 %
Basophils Absolute: 42 cells/uL (ref 0–200)
EOS ABS: 84 {cells}/uL (ref 15–500)
Eosinophils Relative: 2 %
HEMATOCRIT: 43.2 % (ref 38.5–50.0)
HEMOGLOBIN: 14.6 g/dL (ref 13.2–17.1)
LYMPHS ABS: 1764 {cells}/uL (ref 850–3900)
LYMPHS PCT: 42 %
MCH: 28.3 pg (ref 27.0–33.0)
MCHC: 33.8 g/dL (ref 32.0–36.0)
MCV: 83.9 fL (ref 80.0–100.0)
MONO ABS: 252 {cells}/uL (ref 200–950)
MPV: 10.3 fL (ref 7.5–12.5)
Monocytes Relative: 6 %
Neutro Abs: 2058 cells/uL (ref 1500–7800)
Neutrophils Relative %: 49 %
Platelets: 207 10*3/uL (ref 140–400)
RBC: 5.15 MIL/uL (ref 4.20–5.80)
RDW: 13.7 % (ref 11.0–15.0)
WBC: 4.2 10*3/uL (ref 3.8–10.8)

## 2017-05-06 NOTE — Progress Notes (Addendum)
Study: A Phase 2b/3 Double Blind Safety and Efficacy Study of Injectable Cabotegravir compared to Daily Oral Tenofovir Disoproxil Fumarate/Emtricitabine (TDF/FTC), For Pre-Exposure Prophylaxis in HIV-Uninfected Cisgender Men and Transgender Women who have sex with Men.  Medication: Investigational Injectable Cabotegravir/placebo compared to Truvada/placebo. Duration: Around 4 years.  [name] is here for week 65. Took 3 days of Diflucan for yeast infection. Denies any other issues. Returned #19 pills of oral study products. He had them in his pill box. I emptied this into a plastic zip-lock bag and returned to Pharmacy. Blood drawn and HIV rapid confirmed non-reactive. Questionnaires completed. Injection given in right gluteal muscle with no problem and oral study drug dispensed. He received $50 gift card for visit and declined condoms. Next appointment scheduled for 6/19.

## 2017-05-06 NOTE — Progress Notes (Signed)
S/w ppt while in exam rm; confirmed new cell number. Ppt would like text reminders about 2-3 days ahead of appt. The ppt works M-F, 7-5:30, and needs the latest appt time for study visits (or Saturdays if possible). Ppt also needs a doctor's note for employer for every visit.   Gave ppt info about mychart and sent a text request to sign up.  Ppt gave verbal consent to rcv mail at current mailing address.

## 2017-05-07 LAB — COMPREHENSIVE METABOLIC PANEL
ALT: 32 U/L (ref 9–46)
AST: 23 U/L (ref 10–40)
Albumin: 4.5 g/dL (ref 3.6–5.1)
Alkaline Phosphatase: 79 U/L (ref 40–115)
BUN: 14 mg/dL (ref 7–25)
CHLORIDE: 103 mmol/L (ref 98–110)
CO2: 22 mmol/L (ref 20–31)
Calcium: 9.5 mg/dL (ref 8.6–10.3)
Creat: 1.09 mg/dL (ref 0.60–1.35)
Glucose, Bld: 96 mg/dL (ref 65–99)
Potassium: 3.8 mmol/L (ref 3.5–5.3)
Sodium: 139 mmol/L (ref 135–146)
TOTAL PROTEIN: 7.1 g/dL (ref 6.1–8.1)
Total Bilirubin: 0.7 mg/dL (ref 0.2–1.2)

## 2017-05-07 LAB — HIV ANTIBODY (ROUTINE TESTING W REFLEX): HIV: NONREACTIVE

## 2017-05-07 LAB — LIPASE: Lipase: 42 U/L (ref 7–60)

## 2017-05-07 LAB — CK: Total CK: 188 U/L (ref 44–196)

## 2017-05-07 LAB — AMYLASE: Amylase: 42 U/L (ref 21–101)

## 2017-05-07 LAB — PHOSPHORUS: Phosphorus: 3.8 mg/dL (ref 2.5–4.5)

## 2017-05-20 ENCOUNTER — Encounter (INDEPENDENT_AMBULATORY_CARE_PROVIDER_SITE_OTHER): Payer: Self-pay | Admitting: *Deleted

## 2017-05-20 VITALS — BP 125/82 | HR 55 | Temp 98.1°F | Wt 174.5 lb

## 2017-05-20 DIAGNOSIS — Z006 Encounter for examination for normal comparison and control in clinical research program: Secondary | ICD-10-CM

## 2017-05-20 LAB — CBC WITH DIFFERENTIAL/PLATELET
BASOS ABS: 0 {cells}/uL (ref 0–200)
Basophils Relative: 0 %
Eosinophils Absolute: 92 cells/uL (ref 15–500)
Eosinophils Relative: 2 %
HCT: 43.3 % (ref 38.5–50.0)
Hemoglobin: 14.8 g/dL (ref 13.2–17.1)
LYMPHS PCT: 46 %
Lymphs Abs: 2116 cells/uL (ref 850–3900)
MCH: 29.1 pg (ref 27.0–33.0)
MCHC: 34.2 g/dL (ref 32.0–36.0)
MCV: 85.1 fL (ref 80.0–100.0)
MONOS PCT: 6 %
MPV: 10 fL (ref 7.5–12.5)
Monocytes Absolute: 276 cells/uL (ref 200–950)
NEUTROS ABS: 2116 {cells}/uL (ref 1500–7800)
NEUTROS PCT: 46 %
PLATELETS: 231 10*3/uL (ref 140–400)
RBC: 5.09 MIL/uL (ref 4.20–5.80)
RDW: 13.5 % (ref 11.0–15.0)
WBC: 4.6 10*3/uL (ref 3.8–10.8)

## 2017-05-20 NOTE — Progress Notes (Signed)
Study: A Phase 2b/3 Double Blind Safety and Efficacy Study of Injectable Cabotegravir compared to Daily Oral Tenofovir Disoproxil Fumarate/Emtricitabine (TDF/FTC), For Pre-Exposure Prophylaxis in HIV-Uninfected Cisgender Men and Transgender Women who have sex with Men.  Medication: Investigational Injectable Cabotegravir/placebo compared to Truvada/placebo. Duration: Around 4 years.  [name] is here for week 67 visit. Stated mild tenderness after last injection which lasted a few days. Rapid HIV non-reactive. Condoms/lube provided. Next visit scheduled for 7/23 @ 8:00am.

## 2017-05-21 LAB — COMPREHENSIVE METABOLIC PANEL
ALBUMIN: 4.7 g/dL (ref 3.6–5.1)
ALT: 33 U/L (ref 9–46)
AST: 24 U/L (ref 10–40)
Alkaline Phosphatase: 87 U/L (ref 40–115)
BUN: 15 mg/dL (ref 7–25)
CHLORIDE: 103 mmol/L (ref 98–110)
CO2: 27 mmol/L (ref 20–31)
CREATININE: 1.12 mg/dL (ref 0.60–1.35)
Calcium: 9 mg/dL (ref 8.6–10.3)
Glucose, Bld: 90 mg/dL (ref 65–99)
Potassium: 4.1 mmol/L (ref 3.5–5.3)
Sodium: 139 mmol/L (ref 135–146)
Total Bilirubin: 0.7 mg/dL (ref 0.2–1.2)
Total Protein: 7.1 g/dL (ref 6.1–8.1)

## 2017-05-21 LAB — PHOSPHORUS: Phosphorus: 3.3 mg/dL (ref 2.5–4.5)

## 2017-05-21 LAB — CK: Total CK: 330 U/L — ABNORMAL HIGH (ref 44–196)

## 2017-05-21 LAB — LIPASE: LIPASE: 33 U/L (ref 7–60)

## 2017-05-21 LAB — AMYLASE: Amylase: 47 U/L (ref 21–101)

## 2017-05-21 LAB — HIV ANTIBODY (ROUTINE TESTING W REFLEX): HIV 1&2 Ab, 4th Generation: NONREACTIVE

## 2017-06-23 ENCOUNTER — Encounter (INDEPENDENT_AMBULATORY_CARE_PROVIDER_SITE_OTHER): Payer: Self-pay | Admitting: *Deleted

## 2017-06-23 VITALS — BP 122/77 | HR 51 | Temp 97.9°F | Wt 174.0 lb

## 2017-06-23 DIAGNOSIS — Z006 Encounter for examination for normal comparison and control in clinical research program: Secondary | ICD-10-CM

## 2017-06-23 LAB — CBC WITH DIFFERENTIAL/PLATELET
BASOS ABS: 42 {cells}/uL (ref 0–200)
BASOS PCT: 1 %
EOS ABS: 84 {cells}/uL (ref 15–500)
Eosinophils Relative: 2 %
HEMATOCRIT: 44.1 % (ref 38.5–50.0)
Hemoglobin: 15.2 g/dL (ref 13.2–17.1)
Lymphocytes Relative: 37 %
Lymphs Abs: 1554 cells/uL (ref 850–3900)
MCH: 29.2 pg (ref 27.0–33.0)
MCHC: 34.5 g/dL (ref 32.0–36.0)
MCV: 84.6 fL (ref 80.0–100.0)
MONO ABS: 252 {cells}/uL (ref 200–950)
MPV: 10.3 fL (ref 7.5–12.5)
Monocytes Relative: 6 %
NEUTROS ABS: 2268 {cells}/uL (ref 1500–7800)
Neutrophils Relative %: 54 %
Platelets: 221 10*3/uL (ref 140–400)
RBC: 5.21 MIL/uL (ref 4.20–5.80)
RDW: 13.2 % (ref 11.0–15.0)
WBC: 4.2 10*3/uL (ref 3.8–10.8)

## 2017-06-23 LAB — COMPREHENSIVE METABOLIC PANEL
ALBUMIN: 4.7 g/dL (ref 3.6–5.1)
ALK PHOS: 80 U/L (ref 40–115)
ALT: 49 U/L — AB (ref 9–46)
AST: 28 U/L (ref 10–40)
BUN: 16 mg/dL (ref 7–25)
CALCIUM: 9 mg/dL (ref 8.6–10.3)
CHLORIDE: 106 mmol/L (ref 98–110)
CO2: 27 mmol/L (ref 20–31)
Creat: 1.06 mg/dL (ref 0.60–1.35)
Glucose, Bld: 93 mg/dL (ref 65–99)
POTASSIUM: 3.8 mmol/L (ref 3.5–5.3)
Sodium: 139 mmol/L (ref 135–146)
TOTAL PROTEIN: 7.1 g/dL (ref 6.1–8.1)
Total Bilirubin: 1 mg/dL (ref 0.2–1.2)

## 2017-06-23 LAB — HIV ANTIBODY (ROUTINE TESTING W REFLEX): HIV 1&2 Ab, 4th Generation: NONREACTIVE

## 2017-06-23 LAB — CK: CK TOTAL: 177 U/L (ref 44–196)

## 2017-06-23 LAB — PHOSPHORUS: PHOSPHORUS: 3.7 mg/dL (ref 2.5–4.5)

## 2017-06-23 LAB — AMYLASE: AMYLASE: 46 U/L (ref 21–101)

## 2017-06-23 LAB — LIPASE: LIPASE: 34 U/L (ref 7–60)

## 2017-06-23 NOTE — Progress Notes (Signed)
Study: A Phase 2b/3 Double Blind Safety and Efficacy Study of Injectable Cabotegravir compared to Daily Oral Tenofovir Disoproxil Fumarate/Emtricitabine (TDF/FTC), For Pre-Exposure Prophylaxis in HIV-Uninfected Cisgender Men and Transgender Women who have sex with Men.  Medication: Investigational Injectable Cabotegravir/placebo compared to Truvada/placebo. Duration: Around 4 years.  [name] is here for his week 73 visit. [facility]specific [facility]. Continues to have intermittent "itching" sensation with voiding. Encouraged him to follow-up with his PCP. No other concerns or complaints verbalized. Rapid HIV non-reactive. Cabotegravir/placebo injection given (L) gluteal muscle. Site unremarkable. Condoms/lube provided. Next visit scheduled for 8/8 @ 4:00pm.

## 2017-07-09 ENCOUNTER — Encounter: Payer: BLUE CROSS/BLUE SHIELD | Admitting: *Deleted

## 2017-07-09 VITALS — BP 118/78 | HR 50 | Temp 97.9°F | Wt 171.5 lb

## 2017-07-09 DIAGNOSIS — Z006 Encounter for examination for normal comparison and control in clinical research program: Secondary | ICD-10-CM

## 2017-07-09 LAB — CBC WITH DIFFERENTIAL/PLATELET
BASOS ABS: 0 {cells}/uL (ref 0–200)
Basophils Relative: 0 %
EOS ABS: 58 {cells}/uL (ref 15–500)
Eosinophils Relative: 2 %
HEMATOCRIT: 43.7 % (ref 38.5–50.0)
HEMOGLOBIN: 14.7 g/dL (ref 13.2–17.1)
LYMPHS ABS: 1189 {cells}/uL (ref 850–3900)
Lymphocytes Relative: 41 %
MCH: 28.7 pg (ref 27.0–33.0)
MCHC: 33.6 g/dL (ref 32.0–36.0)
MCV: 85.2 fL (ref 80.0–100.0)
MONO ABS: 290 {cells}/uL (ref 200–950)
MPV: 9.9 fL (ref 7.5–12.5)
Monocytes Relative: 10 %
NEUTROS ABS: 1363 {cells}/uL — AB (ref 1500–7800)
Neutrophils Relative %: 47 %
Platelets: 193 10*3/uL (ref 140–400)
RBC: 5.13 MIL/uL (ref 4.20–5.80)
RDW: 13.3 % (ref 11.0–15.0)
WBC: 2.9 10*3/uL — ABNORMAL LOW (ref 3.8–10.8)

## 2017-07-09 NOTE — Progress Notes (Unsigned)
Study: A Phase 2b/3 Double Blind Safety and Efficacy Study of Injectable Cabotegravir compared to Daily Oral Tenofovir Disoproxil Fumarate/Emtricitabine (TDF/FTC), For Pre-Exposure Prophylaxis in HIV-Uninfected Cisgender Men and Transgender Women who have sex with Men.  Medication: Investigational Injectable Cabotegravir/placebo compared to Truvada/placebo. Duration: Around 4 years.  [name] is here for week 75 visit. States only mild tenderness after last injection which lasted a couple of days. He states no problems with adherence with his oral study medication. Rapid HIV non-reactive. Declined condoms/lube. Nex t injection visit scheduled for 08/21/17 at 4:00pm.

## 2017-07-10 LAB — COMPREHENSIVE METABOLIC PANEL
ALBUMIN: 4.7 g/dL (ref 3.6–5.1)
ALT: 92 U/L — ABNORMAL HIGH (ref 9–46)
AST: 68 U/L — ABNORMAL HIGH (ref 10–40)
Alkaline Phosphatase: 87 U/L (ref 40–115)
BUN: 13 mg/dL (ref 7–25)
CALCIUM: 9.1 mg/dL (ref 8.6–10.3)
CHLORIDE: 105 mmol/L (ref 98–110)
CO2: 21 mmol/L (ref 20–32)
CREATININE: 1.09 mg/dL (ref 0.60–1.35)
Glucose, Bld: 89 mg/dL (ref 65–99)
POTASSIUM: 4.1 mmol/L (ref 3.5–5.3)
SODIUM: 140 mmol/L (ref 135–146)
TOTAL PROTEIN: 7.2 g/dL (ref 6.1–8.1)
Total Bilirubin: 0.8 mg/dL (ref 0.2–1.2)

## 2017-07-10 LAB — AMYLASE: AMYLASE: 45 U/L (ref 21–101)

## 2017-07-10 LAB — LIPASE: LIPASE: 51 U/L (ref 7–60)

## 2017-07-10 LAB — HIV ANTIBODY (ROUTINE TESTING W REFLEX): HIV 1&2 Ab, 4th Generation: NONREACTIVE

## 2017-07-10 LAB — PHOSPHORUS: PHOSPHORUS: 3.1 mg/dL (ref 2.5–4.5)

## 2017-07-10 LAB — CK: CK TOTAL: 833 U/L — AB (ref 44–196)

## 2017-08-20 ENCOUNTER — Encounter (INDEPENDENT_AMBULATORY_CARE_PROVIDER_SITE_OTHER): Payer: Self-pay | Admitting: *Deleted

## 2017-08-20 VITALS — Wt 172.0 lb

## 2017-08-20 DIAGNOSIS — Z006 Encounter for examination for normal comparison and control in clinical research program: Secondary | ICD-10-CM

## 2017-08-21 ENCOUNTER — Encounter: Payer: BLUE CROSS/BLUE SHIELD | Admitting: *Deleted

## 2017-08-21 LAB — RPR: RPR: NONREACTIVE

## 2017-08-21 LAB — CBC WITH DIFFERENTIAL/PLATELET
BASOS ABS: 20 {cells}/uL (ref 0–200)
Basophils Relative: 0.5 %
EOS ABS: 82 {cells}/uL (ref 15–500)
EOS PCT: 2.1 %
HEMATOCRIT: 42.6 % (ref 38.5–50.0)
Hemoglobin: 14.8 g/dL (ref 13.2–17.1)
LYMPHS ABS: 1833 {cells}/uL (ref 850–3900)
MCH: 28.8 pg (ref 27.0–33.0)
MCHC: 34.7 g/dL (ref 32.0–36.0)
MCV: 83 fL (ref 80.0–100.0)
MPV: 10.5 fL (ref 7.5–12.5)
Monocytes Relative: 6.4 %
NEUTROS ABS: 1716 {cells}/uL (ref 1500–7800)
Neutrophils Relative %: 44 %
PLATELETS: 223 10*3/uL (ref 140–400)
RBC: 5.13 10*6/uL (ref 4.20–5.80)
RDW: 13.2 % (ref 11.0–15.0)
Total Lymphocyte: 47 %
WBC mixed population: 250 cells/uL (ref 200–950)
WBC: 3.9 10*3/uL (ref 3.8–10.8)

## 2017-08-21 LAB — COMPREHENSIVE METABOLIC PANEL
AG Ratio: 2 (calc) (ref 1.0–2.5)
ALBUMIN MSPROF: 4.7 g/dL (ref 3.6–5.1)
ALT: 44 U/L (ref 9–46)
AST: 39 U/L (ref 10–40)
Alkaline phosphatase (APISO): 79 U/L (ref 40–115)
BUN: 11 mg/dL (ref 7–25)
CO2: 26 mmol/L (ref 20–32)
CREATININE: 1.09 mg/dL (ref 0.60–1.35)
Calcium: 8.8 mg/dL (ref 8.6–10.3)
Chloride: 105 mmol/L (ref 98–110)
GLUCOSE: 101 mg/dL — AB (ref 65–99)
Globulin: 2.3 g/dL (calc) (ref 1.9–3.7)
POTASSIUM: 3.9 mmol/L (ref 3.5–5.3)
SODIUM: 138 mmol/L (ref 135–146)
TOTAL PROTEIN: 7 g/dL (ref 6.1–8.1)
Total Bilirubin: 0.7 mg/dL (ref 0.2–1.2)

## 2017-08-21 LAB — C. TRACHOMATIS/N. GONORRHOEAE RNA
C. TRACHOMATIS RNA, TMA: NOT DETECTED
N. gonorrhoeae RNA, TMA: NOT DETECTED

## 2017-08-21 LAB — HIV ANTIBODY (ROUTINE TESTING W REFLEX): HIV 1&2 Ab, 4th Generation: NONREACTIVE

## 2017-08-21 LAB — AMYLASE: Amylase: 43 U/L (ref 21–101)

## 2017-08-21 LAB — CK: CK TOTAL: 857 U/L — AB (ref 44–196)

## 2017-08-21 LAB — LIPASE: Lipase: 28 U/L (ref 7–60)

## 2017-08-21 LAB — PHOSPHORUS: Phosphorus: 3.8 mg/dL (ref 2.5–4.5)

## 2017-08-22 LAB — CT/NG RNA, TMA RECTAL
CHLAMYDIA TRACHOMATIS RNA, TMA, RECTAL: NOT DETECTED
NEISSERIA GONORRHOEAE RNA, TMA, RECTAL: NOT DETECTED

## 2017-08-26 NOTE — Progress Notes (Signed)
Study: A Phase 2b/3 Double Blind Safety and Efficacy Study of Injectable Cabotegravir compared to Daily Oral Tenofovir Disoproxil Fumarate/Emtricitabine (TDF/FTC), For Pre-Exposure Prophylaxis in HIV-Uninfected Cisgender Men and Transgender Women who have sex with Men.  Medication: Investigational Injectable Cabotegravir/placebo compared to Truvada/placebo. Duration: Around 4 years.  Frederick Hampton is here for week 81.  After verifying the correct version I explained/reviewed version 2.0 informed consent in the language that he understood. Risk, benefits, responsibilities, and other options were reviewed. I answered his questions. Comprehension was assessed. He was given adequate time to consider his options. He verbalized understanding and signed the consent witnessed by me. He declined a copy. He was sore from working out on  Monday. This has resolved. No other issues. HIV rapid confirmed non-reactive. Questionnaires completed. He returned #51 of study IP. Injection given in right gluteal muscle with no problems. He received $50 gift card for visit. Next appointment scheduled for 10/3.

## 2017-09-03 ENCOUNTER — Encounter (INDEPENDENT_AMBULATORY_CARE_PROVIDER_SITE_OTHER): Payer: Self-pay | Admitting: *Deleted

## 2017-09-03 VITALS — BP 131/83 | HR 56 | Temp 98.2°F | Wt 173.2 lb

## 2017-09-03 DIAGNOSIS — Z006 Encounter for examination for normal comparison and control in clinical research program: Secondary | ICD-10-CM

## 2017-09-03 NOTE — Progress Notes (Signed)
Study: A Phase 2b/3 Double Blind Safety and Efficacy Study of Injectable Cabotegravir compared to Daily Oral Tenofovir Disoproxil Fumarate/Emtricitabine (TDF/FTC), For Pre-Exposure Prophylaxis in HIV-Uninfected Cisgender Men and Transgender Women who have sex with Men.  Medication: Investigational Injectable Cabotegravir/placebo compared to Truvada/placebo. Duration: Around 4 years.  Frederick Hampton is here for week 83 visit. Mild tenderness after last injection which lasted 5 days. States that he had body aches earlier this week which lasted a day. No other symptoms at the time. Verbalized excellent adherence with his oral study medication. Rapid HIV non-reactive. Declined condoms/lube. Next visit scheduled for 10/15/17 at 4:00pm.

## 2017-09-04 LAB — CBC WITH DIFFERENTIAL/PLATELET
BASOS ABS: 38 {cells}/uL (ref 0–200)
BASOS PCT: 0.8 %
EOS ABS: 89 {cells}/uL (ref 15–500)
Eosinophils Relative: 1.9 %
HCT: 44.1 % (ref 38.5–50.0)
Hemoglobin: 15.4 g/dL (ref 13.2–17.1)
Lymphs Abs: 2082 cells/uL (ref 850–3900)
MCH: 28.9 pg (ref 27.0–33.0)
MCHC: 34.9 g/dL (ref 32.0–36.0)
MCV: 82.7 fL (ref 80.0–100.0)
MPV: 10.3 fL (ref 7.5–12.5)
Monocytes Relative: 5.7 %
NEUTROS PCT: 47.3 %
Neutro Abs: 2223 cells/uL (ref 1500–7800)
PLATELETS: 242 10*3/uL (ref 140–400)
RBC: 5.33 10*6/uL (ref 4.20–5.80)
RDW: 13 % (ref 11.0–15.0)
TOTAL LYMPHOCYTE: 44.3 %
WBC: 4.7 10*3/uL (ref 3.8–10.8)
WBCMIX: 268 {cells}/uL (ref 200–950)

## 2017-09-04 LAB — COMPREHENSIVE METABOLIC PANEL
AG RATIO: 1.9 (calc) (ref 1.0–2.5)
ALBUMIN MSPROF: 4.8 g/dL (ref 3.6–5.1)
ALT: 46 U/L (ref 9–46)
AST: 30 U/L (ref 10–40)
Alkaline phosphatase (APISO): 87 U/L (ref 40–115)
BUN: 10 mg/dL (ref 7–25)
CO2: 28 mmol/L (ref 20–32)
CREATININE: 1.02 mg/dL (ref 0.60–1.35)
Calcium: 9.2 mg/dL (ref 8.6–10.3)
Chloride: 102 mmol/L (ref 98–110)
GLOBULIN: 2.5 g/dL (ref 1.9–3.7)
GLUCOSE: 129 mg/dL — AB (ref 65–99)
POTASSIUM: 3.7 mmol/L (ref 3.5–5.3)
SODIUM: 139 mmol/L (ref 135–146)
Total Bilirubin: 0.7 mg/dL (ref 0.2–1.2)
Total Protein: 7.3 g/dL (ref 6.1–8.1)

## 2017-09-04 LAB — AMYLASE: AMYLASE: 44 U/L (ref 21–101)

## 2017-09-04 LAB — CK: CK TOTAL: 286 U/L — AB (ref 44–196)

## 2017-09-04 LAB — HIV ANTIBODY (ROUTINE TESTING W REFLEX): HIV 1&2 Ab, 4th Generation: NONREACTIVE

## 2017-09-04 LAB — PHOSPHORUS: Phosphorus: 3.2 mg/dL (ref 2.5–4.5)

## 2017-09-04 LAB — LIPASE: Lipase: 50 U/L (ref 7–60)

## 2017-10-15 ENCOUNTER — Encounter (INDEPENDENT_AMBULATORY_CARE_PROVIDER_SITE_OTHER): Payer: BLUE CROSS/BLUE SHIELD | Admitting: *Deleted

## 2017-10-15 VITALS — BP 134/83 | HR 60 | Temp 98.0°F | Wt 175.0 lb

## 2017-10-15 DIAGNOSIS — Z006 Encounter for examination for normal comparison and control in clinical research program: Secondary | ICD-10-CM

## 2017-10-16 LAB — CBC WITH DIFFERENTIAL/PLATELET
Basophils Absolute: 28 cells/uL (ref 0–200)
Basophils Relative: 0.4 %
EOS PCT: 1 %
Eosinophils Absolute: 70 cells/uL (ref 15–500)
HEMATOCRIT: 42.2 % (ref 38.5–50.0)
Hemoglobin: 14.8 g/dL (ref 13.2–17.1)
LYMPHS ABS: 2030 {cells}/uL (ref 850–3900)
MCH: 28.7 pg (ref 27.0–33.0)
MCHC: 35.1 g/dL (ref 32.0–36.0)
MCV: 81.8 fL (ref 80.0–100.0)
MPV: 11 fL (ref 7.5–12.5)
Monocytes Relative: 5.3 %
NEUTROS ABS: 4501 {cells}/uL (ref 1500–7800)
NEUTROS PCT: 64.3 %
Platelets: 235 10*3/uL (ref 140–400)
RBC: 5.16 10*6/uL (ref 4.20–5.80)
RDW: 13 % (ref 11.0–15.0)
Total Lymphocyte: 29 %
WBC mixed population: 371 cells/uL (ref 200–950)
WBC: 7 10*3/uL (ref 3.8–10.8)

## 2017-10-16 LAB — COMPREHENSIVE METABOLIC PANEL
AG Ratio: 2 (calc) (ref 1.0–2.5)
ALKALINE PHOSPHATASE (APISO): 79 U/L (ref 40–115)
ALT: 37 U/L (ref 9–46)
AST: 28 U/L (ref 10–40)
Albumin: 4.7 g/dL (ref 3.6–5.1)
BILIRUBIN TOTAL: 0.7 mg/dL (ref 0.2–1.2)
BUN: 15 mg/dL (ref 7–25)
CO2: 26 mmol/L (ref 20–32)
Calcium: 9.1 mg/dL (ref 8.6–10.3)
Chloride: 106 mmol/L (ref 98–110)
Creat: 0.98 mg/dL (ref 0.60–1.35)
Globulin: 2.3 g/dL (calc) (ref 1.9–3.7)
Glucose, Bld: 107 mg/dL — ABNORMAL HIGH (ref 65–99)
Potassium: 4 mmol/L (ref 3.5–5.3)
SODIUM: 141 mmol/L (ref 135–146)
Total Protein: 7 g/dL (ref 6.1–8.1)

## 2017-10-16 LAB — CK: Total CK: 386 U/L — ABNORMAL HIGH (ref 44–196)

## 2017-10-16 LAB — HIV ANTIBODY (ROUTINE TESTING W REFLEX): HIV 1&2 Ab, 4th Generation: NONREACTIVE

## 2017-10-16 LAB — LIPASE: Lipase: 34 U/L (ref 7–60)

## 2017-10-16 LAB — PHOSPHORUS: PHOSPHORUS: 3.6 mg/dL (ref 2.5–4.5)

## 2017-10-16 LAB — AMYLASE: AMYLASE: 38 U/L (ref 21–101)

## 2017-10-16 MED ORDER — FLUTICASONE PROPIONATE 50 MCG/ACT NA SUSP: 1.0000 | Freq: Every day | NASAL | 0 refills | Status: AC

## 2017-10-17 NOTE — Progress Notes (Signed)
Study: A Phase 2b/3 Double Blind Safety and Efficacy Study of Injectable Cabotegravir compared to Daily Oral Tenofovir Disoproxil Fumarate/Emtricitabine (TDF/FTC), For Pre-Exposure Prophylaxis in HIV-Uninfected Cisgender Men and Transgender Women who have sex with Men.  Medication: Investigational Injectable Cabotegravir/placebo compared to Truvada/placebo. Duration: Around 4 years.  [name] is here for week 89. He denies any new issues. He has been working out for 2 weeks and mood has improved. Questionnaires completed. HIV rapid confirmed non-reactive. He returned #41 pills of oral study products. Injection given in left gluteal muscle with no problems. Oral study product dispensed. Next appointment scheduled for 11/28.

## 2017-10-29 ENCOUNTER — Encounter: Payer: BLUE CROSS/BLUE SHIELD | Admitting: *Deleted

## 2017-10-31 ENCOUNTER — Encounter (INDEPENDENT_AMBULATORY_CARE_PROVIDER_SITE_OTHER): Payer: BLUE CROSS/BLUE SHIELD | Admitting: *Deleted

## 2017-10-31 VITALS — BP 112/76 | HR 54 | Temp 97.6°F | Wt 172.2 lb

## 2017-10-31 DIAGNOSIS — Z006 Encounter for examination for normal comparison and control in clinical research program: Secondary | ICD-10-CM

## 2017-10-31 NOTE — Progress Notes (Signed)
Study: A Phase 2b/3 Double Blind Safety and Efficacy Study of Injectable Cabotegravir compared to Daily Oral Tenofovir Disoproxil Fumarate/Emtricitabine (TDF/FTC), For Pre-Exposure Prophylaxis in HIV-Uninfected Cisgender Men and Transgender Women who have sex with Men.  Medication: Investigational Injectable Cabotegravir/placebo compared to Truvada/placebo. Duration: Around 4 years.  [name] is here for week 91 visit. Mild tenderness after last injection which lasted a few days. States that he has been working out at [employer] everyday for the last few months. Last worked out this morning. Feels like he is getting a "cold". Complaining of chest congestion, runny nose and body aches which started 2 days ago. Started OTC cold medication yesterday. Rapid HIV non-reactive. He says he has been adherent with his oral study medication. Next visit scheduled for 12/15/17 at 8:30am.

## 2017-11-01 LAB — CBC WITH DIFFERENTIAL/PLATELET
BASOS PCT: 0.5 %
Basophils Absolute: 33 cells/uL (ref 0–200)
EOS PCT: 0.6 %
Eosinophils Absolute: 39 cells/uL (ref 15–500)
HCT: 43.4 % (ref 38.5–50.0)
Hemoglobin: 14.9 g/dL (ref 13.2–17.1)
LYMPHS ABS: 1775 {cells}/uL (ref 850–3900)
MCH: 28.7 pg (ref 27.0–33.0)
MCHC: 34.3 g/dL (ref 32.0–36.0)
MCV: 83.6 fL (ref 80.0–100.0)
MPV: 10.9 fL (ref 7.5–12.5)
Monocytes Relative: 4.8 %
Neutro Abs: 4342 cells/uL (ref 1500–7800)
Neutrophils Relative %: 66.8 %
PLATELETS: 242 10*3/uL (ref 140–400)
RBC: 5.19 10*6/uL (ref 4.20–5.80)
RDW: 13 % (ref 11.0–15.0)
TOTAL LYMPHOCYTE: 27.3 %
WBC: 6.5 10*3/uL (ref 3.8–10.8)
WBCMIX: 312 {cells}/uL (ref 200–950)

## 2017-11-01 LAB — COMPREHENSIVE METABOLIC PANEL
AG Ratio: 1.8 (calc) (ref 1.0–2.5)
ALKALINE PHOSPHATASE (APISO): 80 U/L (ref 40–115)
ALT: 38 U/L (ref 9–46)
AST: 31 U/L (ref 10–40)
Albumin: 4.6 g/dL (ref 3.6–5.1)
BUN: 17 mg/dL (ref 7–25)
CO2: 28 mmol/L (ref 20–32)
CREATININE: 1.05 mg/dL (ref 0.60–1.35)
Calcium: 9.4 mg/dL (ref 8.6–10.3)
Chloride: 105 mmol/L (ref 98–110)
GLUCOSE: 92 mg/dL (ref 65–99)
Globulin: 2.6 g/dL (calc) (ref 1.9–3.7)
Potassium: 4.1 mmol/L (ref 3.5–5.3)
Sodium: 140 mmol/L (ref 135–146)
Total Bilirubin: 0.9 mg/dL (ref 0.2–1.2)
Total Protein: 7.2 g/dL (ref 6.1–8.1)

## 2017-11-01 LAB — PHOSPHORUS: Phosphorus: 3.4 mg/dL (ref 2.5–4.5)

## 2017-11-01 LAB — HIV ANTIBODY (ROUTINE TESTING W REFLEX): HIV 1&2 Ab, 4th Generation: NONREACTIVE

## 2017-11-01 LAB — LIPASE: LIPASE: 38 U/L (ref 7–60)

## 2017-11-01 LAB — CK: Total CK: 264 U/L — ABNORMAL HIGH (ref 44–196)

## 2017-11-01 LAB — AMYLASE: AMYLASE: 40 U/L (ref 21–101)

## 2017-11-11 IMAGING — CR DG CHEST 2V
2 series · 2 of 2 positions shown · non-contrast
Comparison: No recent prior .

CLINICAL DATA: Chest pain.

EXAM:
CHEST  2 VIEW

[w chest pa]
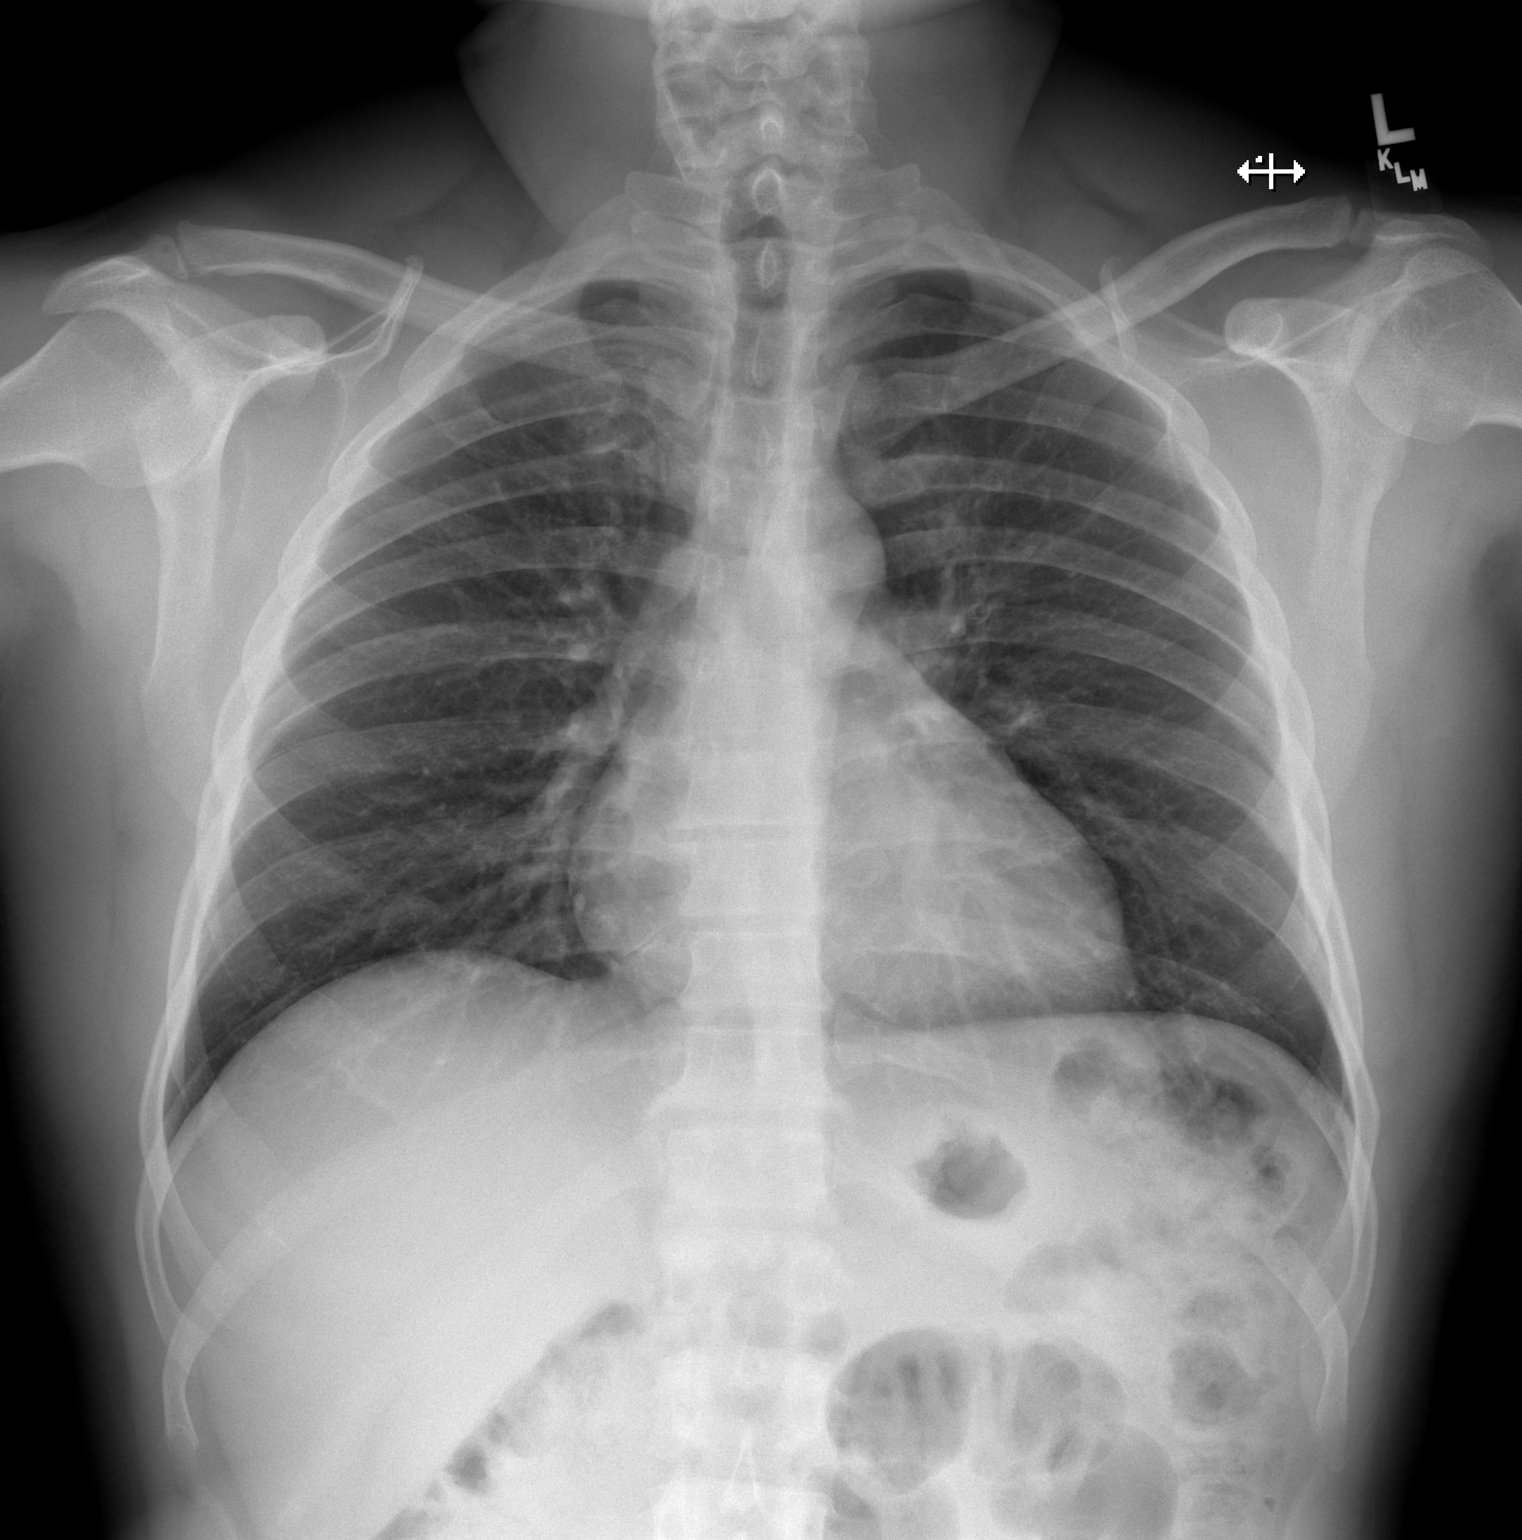

[w chest lat]
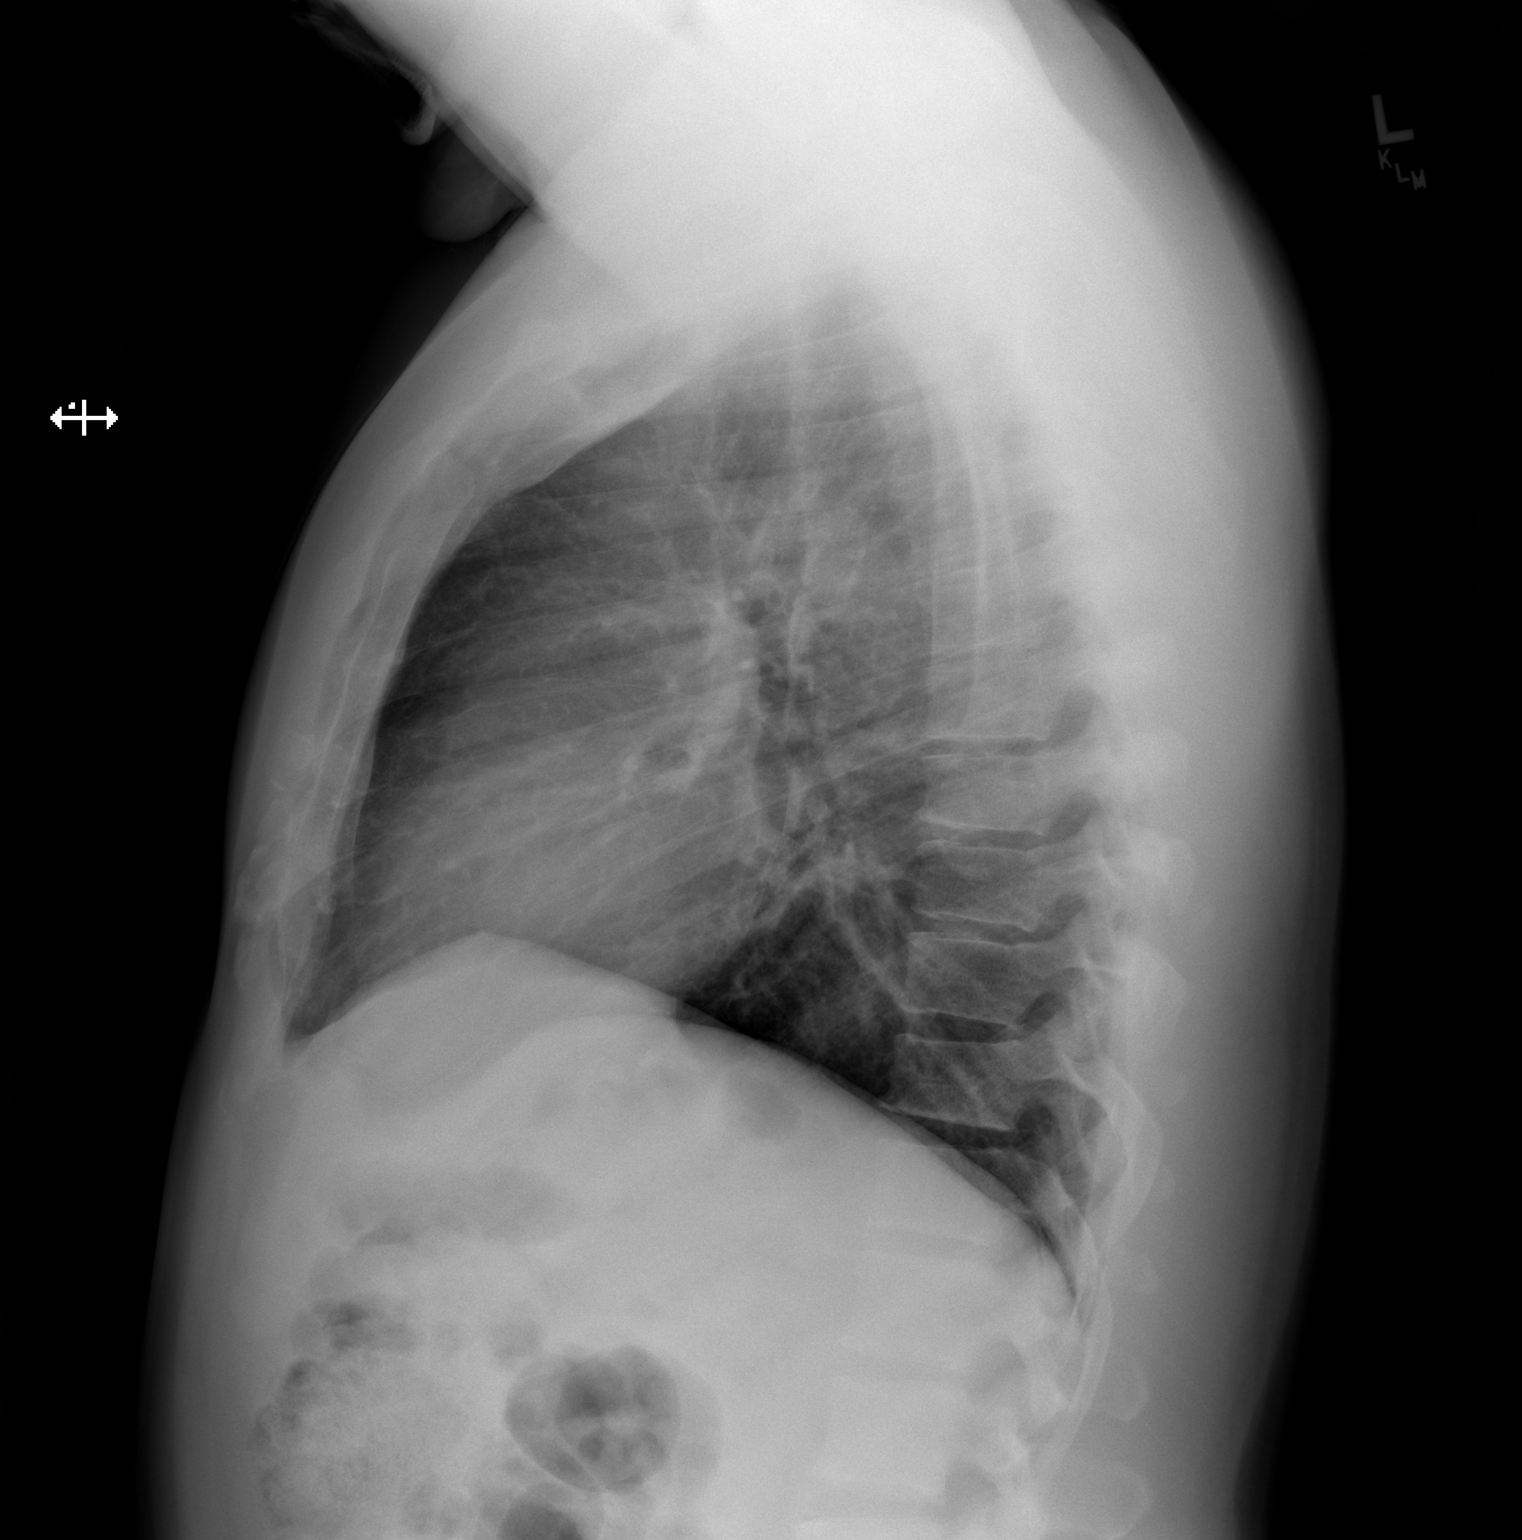

[2 of 2 positions shown; findings below may reference images not displayed]

FINDINGS: Mediastinum and hilar structures are normal. Very mild left
perihilar infiltrate cannot be excluded. No pleural effusion
pneumothorax. Heart size normal. No acute bony abnormality.
IMPRESSION: Very mild left perihilar infiltrate cannot be excluded. Exam
otherwise unremarkable.

## 2017-12-15 ENCOUNTER — Encounter (INDEPENDENT_AMBULATORY_CARE_PROVIDER_SITE_OTHER): Payer: BLUE CROSS/BLUE SHIELD | Admitting: *Deleted

## 2017-12-15 VITALS — BP 114/75 | HR 53 | Temp 97.6°F | Wt 170.0 lb

## 2017-12-15 DIAGNOSIS — Z006 Encounter for examination for normal comparison and control in clinical research program: Secondary | ICD-10-CM

## 2017-12-15 NOTE — Progress Notes (Signed)
Study: A Phase 2b/3 Double Blind Safety and Efficacy Study of Injectable Cabotegravir compared to Daily Oral Tenofovir Disoproxil Fumarate/Emtricitabine (TDF/FTC), For Pre-Exposure Prophylaxis in HIV-Uninfected Cisgender Men and Transgender Women who have sex with Men.  Medication: Investigational Injectable Cabotegravir/placebo compared to Truvada/placebo. Duration: Around 4 years.  [name] is here for week 97. He missed 3 weeks of oral study product. He failed to bring them with him to [place]. He stated he did not engage in risky sexual behavior while in [place]. He started taking his Truvada/Placebo when he returned last week. He was seen by physician in [place] for flu and treated with IV fluids. All have resolved. No other issues. HIV rapid confirmed non-reactive. Injection given in right gluteal muscle with no problems.

## 2017-12-16 LAB — CBC WITH DIFFERENTIAL/PLATELET
BASOS PCT: 0.6 %
Basophils Absolute: 31 cells/uL (ref 0–200)
EOS ABS: 51 {cells}/uL (ref 15–500)
Eosinophils Relative: 1 %
HCT: 44.6 % (ref 38.5–50.0)
HEMOGLOBIN: 15.4 g/dL (ref 13.2–17.1)
LYMPHS ABS: 1556 {cells}/uL (ref 850–3900)
MCH: 28.7 pg (ref 27.0–33.0)
MCHC: 34.5 g/dL (ref 32.0–36.0)
MCV: 83.2 fL (ref 80.0–100.0)
MPV: 10.9 fL (ref 7.5–12.5)
Monocytes Relative: 5.7 %
NEUTROS ABS: 3172 {cells}/uL (ref 1500–7800)
Neutrophils Relative %: 62.2 %
Platelets: 263 10*3/uL (ref 140–400)
RBC: 5.36 10*6/uL (ref 4.20–5.80)
RDW: 13.2 % (ref 11.0–15.0)
TOTAL LYMPHOCYTE: 30.5 %
WBC: 5.1 10*3/uL (ref 3.8–10.8)
WBCMIX: 291 {cells}/uL (ref 200–950)

## 2017-12-16 LAB — COMPREHENSIVE METABOLIC PANEL
AG RATIO: 2 (calc) (ref 1.0–2.5)
ALBUMIN MSPROF: 4.6 g/dL (ref 3.6–5.1)
ALKALINE PHOSPHATASE (APISO): 81 U/L (ref 40–115)
ALT: 42 U/L (ref 9–46)
AST: 44 U/L — ABNORMAL HIGH (ref 10–40)
BILIRUBIN TOTAL: 0.6 mg/dL (ref 0.2–1.2)
BUN: 19 mg/dL (ref 7–25)
CALCIUM: 9.3 mg/dL (ref 8.6–10.3)
CO2: 28 mmol/L (ref 20–32)
Chloride: 107 mmol/L (ref 98–110)
Creat: 0.97 mg/dL (ref 0.60–1.35)
Globulin: 2.3 g/dL (calc) (ref 1.9–3.7)
Glucose, Bld: 103 mg/dL — ABNORMAL HIGH (ref 65–99)
POTASSIUM: 3.8 mmol/L (ref 3.5–5.3)
SODIUM: 140 mmol/L (ref 135–146)
TOTAL PROTEIN: 6.9 g/dL (ref 6.1–8.1)

## 2017-12-16 LAB — LIPASE: LIPASE: 36 U/L (ref 7–60)

## 2017-12-16 LAB — AMYLASE: Amylase: 49 U/L (ref 21–101)

## 2017-12-16 LAB — PHOSPHORUS: Phosphorus: 2.6 mg/dL (ref 2.5–4.5)

## 2017-12-16 LAB — HIV ANTIBODY (ROUTINE TESTING W REFLEX): HIV: NONREACTIVE

## 2017-12-16 LAB — CK: Total CK: 1340 U/L — ABNORMAL HIGH (ref 44–196)

## 2017-12-29 ENCOUNTER — Encounter (INDEPENDENT_AMBULATORY_CARE_PROVIDER_SITE_OTHER): Payer: BLUE CROSS/BLUE SHIELD | Admitting: *Deleted

## 2017-12-29 VITALS — BP 121/81 | HR 55 | Temp 98.0°F | Wt 175.0 lb

## 2017-12-29 DIAGNOSIS — Z006 Encounter for examination for normal comparison and control in clinical research program: Secondary | ICD-10-CM

## 2017-12-29 NOTE — Progress Notes (Signed)
**Note Frederick-Identified via Obfuscation** Frederick Hampton is here for [facility] week [facility] He said he had 3 days of injection site tenderness that started 2 days after the injection. He thinks it occurred because he worked out right after the injection. It wasn't bad enough for him to take anything for pain or use ice. He said he had gone to [place] [employer] and got sick with possibly the flu while he was there. He was treated at a clinic with an IV medication of some type of antiviral but doesn't know what it was. He was also given nucleo CMP forte to help with shoulder soreness and is taking that now for 1 month. It is suppose to help regenerate the joint. He said he missed several doses of study med over the past 2 weeks but due more to getting back on schedule after being out of town and should be getting better at it soon. He said he has also noticed an increase in anxiety and mild depression lately and doesn't know why. We discussed seasonal depression and how to deal with symptoms. He will be returning on March 5th for the next injection.

## 2017-12-30 LAB — CBC WITH DIFFERENTIAL/PLATELET
Basophils Absolute: 31 cells/uL (ref 0–200)
Basophils Relative: 0.6 %
EOS PCT: 1.2 %
Eosinophils Absolute: 62 cells/uL (ref 15–500)
HCT: 42.4 % (ref 38.5–50.0)
Hemoglobin: 14.6 g/dL (ref 13.2–17.1)
Lymphs Abs: 1992 cells/uL (ref 850–3900)
MCH: 28.9 pg (ref 27.0–33.0)
MCHC: 34.4 g/dL (ref 32.0–36.0)
MCV: 84 fL (ref 80.0–100.0)
MPV: 9.9 fL (ref 7.5–12.5)
Monocytes Relative: 5 %
NEUTROS PCT: 54.9 %
Neutro Abs: 2855 cells/uL (ref 1500–7800)
PLATELETS: 243 10*3/uL (ref 140–400)
RBC: 5.05 10*6/uL (ref 4.20–5.80)
RDW: 13.1 % (ref 11.0–15.0)
TOTAL LYMPHOCYTE: 38.3 %
WBC mixed population: 260 cells/uL (ref 200–950)
WBC: 5.2 10*3/uL (ref 3.8–10.8)

## 2017-12-30 LAB — COMPREHENSIVE METABOLIC PANEL
AG RATIO: 1.7 (calc) (ref 1.0–2.5)
ALBUMIN MSPROF: 4.4 g/dL (ref 3.6–5.1)
ALT: 36 U/L (ref 9–46)
AST: 26 U/L (ref 10–40)
Alkaline phosphatase (APISO): 83 U/L (ref 40–115)
BILIRUBIN TOTAL: 0.4 mg/dL (ref 0.2–1.2)
BUN: 19 mg/dL (ref 7–25)
CALCIUM: 9.2 mg/dL (ref 8.6–10.3)
CO2: 32 mmol/L (ref 20–32)
Chloride: 106 mmol/L (ref 98–110)
Creat: 1 mg/dL (ref 0.60–1.35)
GLOBULIN: 2.6 g/dL (ref 1.9–3.7)
GLUCOSE: 107 mg/dL — AB (ref 65–99)
POTASSIUM: 3.9 mmol/L (ref 3.5–5.3)
SODIUM: 141 mmol/L (ref 135–146)
TOTAL PROTEIN: 7 g/dL (ref 6.1–8.1)

## 2017-12-30 LAB — CK: Total CK: 289 U/L — ABNORMAL HIGH (ref 44–196)

## 2017-12-30 LAB — LIPASE: LIPASE: 33 U/L (ref 7–60)

## 2017-12-30 LAB — AMYLASE: AMYLASE: 45 U/L (ref 21–101)

## 2017-12-30 LAB — PHOSPHORUS: Phosphorus: 3.1 mg/dL (ref 2.5–4.5)

## 2017-12-30 LAB — HIV ANTIBODY (ROUTINE TESTING W REFLEX): HIV 1&2 Ab, 4th Generation: NONREACTIVE

## 2018-02-03 ENCOUNTER — Encounter (INDEPENDENT_AMBULATORY_CARE_PROVIDER_SITE_OTHER): Payer: BLUE CROSS/BLUE SHIELD | Admitting: *Deleted

## 2018-02-03 VITALS — BP 125/84 | HR 52 | Temp 98.3°F | Wt 174.0 lb

## 2018-02-03 DIAGNOSIS — Z006 Encounter for examination for normal comparison and control in clinical research program: Secondary | ICD-10-CM

## 2018-02-03 NOTE — Progress Notes (Signed)
[name] is here for his week 105 injection visit [facility] He denies any new problems or concerns. His adherence was 74 % so we talked about ways to improve it. An injection was given in his left buttock without problem. He will return in 2 weeks.

## 2018-02-04 LAB — HEPATITIS C ANTIBODY
HEP C AB: NONREACTIVE
SIGNAL TO CUT-OFF: 0.02 (ref <1.00)

## 2018-02-04 LAB — CBC WITH DIFFERENTIAL/PLATELET
BASOS ABS: 42 {cells}/uL (ref 0–200)
Basophils Relative: 0.9 %
EOS PCT: 1.9 %
Eosinophils Absolute: 89 cells/uL (ref 15–500)
HEMATOCRIT: 44.1 % (ref 38.5–50.0)
Hemoglobin: 15.4 g/dL (ref 13.2–17.1)
LYMPHS ABS: 2068 {cells}/uL (ref 850–3900)
MCH: 29.1 pg (ref 27.0–33.0)
MCHC: 34.9 g/dL (ref 32.0–36.0)
MCV: 83.4 fL (ref 80.0–100.0)
MONOS PCT: 6 %
MPV: 10.5 fL (ref 7.5–12.5)
NEUTROS PCT: 47.2 %
Neutro Abs: 2218 cells/uL (ref 1500–7800)
PLATELETS: 219 10*3/uL (ref 140–400)
RBC: 5.29 10*6/uL (ref 4.20–5.80)
RDW: 13 % (ref 11.0–15.0)
TOTAL LYMPHOCYTE: 44 %
WBC mixed population: 282 cells/uL (ref 200–950)
WBC: 4.7 10*3/uL (ref 3.8–10.8)

## 2018-02-04 LAB — URINALYSIS, ROUTINE W REFLEX MICROSCOPIC
BILIRUBIN URINE: NEGATIVE
Bacteria, UA: NONE SEEN /HPF
GLUCOSE, UA: NEGATIVE
Hgb urine dipstick: NEGATIVE
Hyaline Cast: NONE SEEN /LPF
Ketones, ur: NEGATIVE
NITRITE: NEGATIVE
Protein, ur: NEGATIVE
RBC / HPF: NONE SEEN /HPF (ref 0–2)
SPECIFIC GRAVITY, URINE: 1.018 (ref 1.001–1.03)
SQUAMOUS EPITHELIAL / LPF: NONE SEEN /HPF (ref <=5)
WBC, UA: NONE SEEN /HPF (ref 0–5)
pH: 7 (ref 5.0–8.0)

## 2018-02-04 LAB — COMPREHENSIVE METABOLIC PANEL
AG Ratio: 2 (calc) (ref 1.0–2.5)
ALBUMIN MSPROF: 4.5 g/dL (ref 3.6–5.1)
ALT: 29 U/L (ref 9–46)
AST: 24 U/L (ref 10–40)
Alkaline phosphatase (APISO): 71 U/L (ref 40–115)
BUN: 13 mg/dL (ref 7–25)
CALCIUM: 9.4 mg/dL (ref 8.6–10.3)
CO2: 29 mmol/L (ref 20–32)
CREATININE: 1.1 mg/dL (ref 0.60–1.35)
Chloride: 106 mmol/L (ref 98–110)
GLUCOSE: 86 mg/dL (ref 65–99)
Globulin: 2.3 g/dL (calc) (ref 1.9–3.7)
POTASSIUM: 4 mmol/L (ref 3.5–5.3)
SODIUM: 140 mmol/L (ref 135–146)
TOTAL PROTEIN: 6.8 g/dL (ref 6.1–8.1)
Total Bilirubin: 0.9 mg/dL (ref 0.2–1.2)

## 2018-02-04 LAB — HIV ANTIBODY (ROUTINE TESTING W REFLEX): HIV 1&2 Ab, 4th Generation: NONREACTIVE

## 2018-02-04 LAB — C. TRACHOMATIS/N. GONORRHOEAE RNA
C. TRACHOMATIS RNA, TMA: NOT DETECTED
N. gonorrhoeae RNA, TMA: NOT DETECTED

## 2018-02-04 LAB — LIPID PANEL
CHOL/HDL RATIO: 5.5 (calc) — AB (ref <5.0)
Cholesterol: 202 mg/dL — ABNORMAL HIGH (ref <200)
HDL: 37 mg/dL — ABNORMAL LOW (ref >40)
LDL CHOLESTEROL (CALC): 117 mg/dL — AB
Non-HDL Cholesterol (Calc): 165 mg/dL (calc) — ABNORMAL HIGH (ref <130)
TRIGLYCERIDES: 334 mg/dL — AB (ref <150)

## 2018-02-04 LAB — RPR: RPR Ser Ql: NONREACTIVE

## 2018-02-04 LAB — PHOSPHORUS: PHOSPHORUS: 3.1 mg/dL (ref 2.5–4.5)

## 2018-02-04 LAB — AMYLASE: AMYLASE: 45 U/L (ref 21–101)

## 2018-02-04 LAB — LIPASE: LIPASE: 27 U/L (ref 7–60)

## 2018-02-04 LAB — CK: CK TOTAL: 158 U/L (ref 44–196)

## 2018-02-06 LAB — CT/NG RNA, TMA RECTAL
Chlamydia Trachomatis RNA: NOT DETECTED
Neisseria Gonorrhoeae RNA: NOT DETECTED

## 2018-02-08 ENCOUNTER — Other Ambulatory Visit: Payer: Self-pay | Admitting: Infectious Disease

## 2018-02-08 DIAGNOSIS — K219 Gastro-esophageal reflux disease without esophagitis: Secondary | ICD-10-CM

## 2018-02-09 NOTE — Telephone Encounter (Signed)
Prep study patient requesting refills of flonase and prilosec. Forwarding to research. Andree CossHowell, Graeson Nouri M, RN

## 2018-02-17 ENCOUNTER — Encounter (INDEPENDENT_AMBULATORY_CARE_PROVIDER_SITE_OTHER): Payer: BLUE CROSS/BLUE SHIELD | Admitting: *Deleted

## 2018-02-17 VITALS — BP 122/81 | HR 52 | Temp 98.2°F | Wt 174.8 lb

## 2018-02-17 DIAGNOSIS — Z006 Encounter for examination for normal comparison and control in clinical research program: Secondary | ICD-10-CM

## 2018-02-17 NOTE — Progress Notes (Signed)
[name] is here for his week 107 visit [facility] He said his injection site was sorer than usual. It started hurting him the same day and lasted approximately 1 week. He did not notice any swelling or redness and did not take anything for pain. He does want a prescription for prilosec due to chronic indigestion. He says both hands will go numb and tingle every now and then, but he thinks it may be related more to work than study drug. It has been going on for at least 2 years. He says his adherence has been better since last time. He will return for next study visit on May 1st.

## 2018-02-18 LAB — CBC WITH DIFFERENTIAL/PLATELET
BASOS ABS: 30 {cells}/uL (ref 0–200)
Basophils Relative: 0.7 %
EOS ABS: 108 {cells}/uL (ref 15–500)
EOS PCT: 2.5 %
HEMATOCRIT: 44.4 % (ref 38.5–50.0)
HEMOGLOBIN: 15.2 g/dL (ref 13.2–17.1)
Lymphs Abs: 1664 cells/uL (ref 850–3900)
MCH: 28.6 pg (ref 27.0–33.0)
MCHC: 34.2 g/dL (ref 32.0–36.0)
MCV: 83.6 fL (ref 80.0–100.0)
MONOS PCT: 5.6 %
MPV: 10.5 fL (ref 7.5–12.5)
NEUTROS PCT: 52.5 %
Neutro Abs: 2258 cells/uL (ref 1500–7800)
Platelets: 236 10*3/uL (ref 140–400)
RBC: 5.31 10*6/uL (ref 4.20–5.80)
RDW: 13 % (ref 11.0–15.0)
Total Lymphocyte: 38.7 %
WBC mixed population: 241 cells/uL (ref 200–950)
WBC: 4.3 10*3/uL (ref 3.8–10.8)

## 2018-02-18 LAB — COMPREHENSIVE METABOLIC PANEL
AG RATIO: 1.8 (calc) (ref 1.0–2.5)
ALT: 33 U/L (ref 9–46)
AST: 26 U/L (ref 10–40)
Albumin: 4.4 g/dL (ref 3.6–5.1)
Alkaline phosphatase (APISO): 78 U/L (ref 40–115)
BILIRUBIN TOTAL: 0.8 mg/dL (ref 0.2–1.2)
BUN: 14 mg/dL (ref 7–25)
CALCIUM: 9.2 mg/dL (ref 8.6–10.3)
CO2: 30 mmol/L (ref 20–32)
Chloride: 106 mmol/L (ref 98–110)
Creat: 1 mg/dL (ref 0.60–1.35)
GLUCOSE: 95 mg/dL (ref 65–99)
Globulin: 2.4 g/dL (calc) (ref 1.9–3.7)
Potassium: 4.1 mmol/L (ref 3.5–5.3)
SODIUM: 141 mmol/L (ref 135–146)
TOTAL PROTEIN: 6.8 g/dL (ref 6.1–8.1)

## 2018-02-18 LAB — PHOSPHORUS: PHOSPHORUS: 3.6 mg/dL (ref 2.5–4.5)

## 2018-02-18 LAB — HIV ANTIBODY (ROUTINE TESTING W REFLEX): HIV 1&2 Ab, 4th Generation: NONREACTIVE

## 2018-02-18 LAB — CK: CK TOTAL: 183 U/L (ref 44–196)

## 2018-02-18 LAB — LIPASE: LIPASE: 30 U/L (ref 7–60)

## 2018-02-18 LAB — AMYLASE: Amylase: 47 U/L (ref 21–101)

## 2018-02-19 ENCOUNTER — Other Ambulatory Visit: Payer: Self-pay | Admitting: *Deleted

## 2018-02-19 DIAGNOSIS — K219 Gastro-esophageal reflux disease without esophagitis: Secondary | ICD-10-CM

## 2018-02-19 MED ORDER — OMEPRAZOLE 20 MG PO CPDR: 20.0000 mg | DELAYED_RELEASE_CAPSULE | Freq: Every day | ORAL | 11 refills | Status: AC

## 2018-02-19 NOTE — Telephone Encounter (Signed)
**Note Frederick-Identified via Obfuscation** Dr. Daiva EvesVan Dam okayed refill on prilosec for Frederick Hampton. His insurance will cover the cost of it a lot cheaper than otc.

## 2018-04-01 ENCOUNTER — Encounter: Payer: BLUE CROSS/BLUE SHIELD | Admitting: *Deleted

## 2018-04-08 ENCOUNTER — Encounter (INDEPENDENT_AMBULATORY_CARE_PROVIDER_SITE_OTHER): Payer: BLUE CROSS/BLUE SHIELD | Admitting: *Deleted

## 2018-04-08 VITALS — BP 127/83 | HR 51 | Temp 97.9°F | Wt 176.5 lb

## 2018-04-08 DIAGNOSIS — F32A Depression, unspecified: Secondary | ICD-10-CM

## 2018-04-08 DIAGNOSIS — Z006 Encounter for examination for normal comparison and control in clinical research program: Secondary | ICD-10-CM

## 2018-04-08 DIAGNOSIS — F329 Major depressive disorder, single episode, unspecified: Secondary | ICD-10-CM

## 2018-04-08 MED ORDER — PAROXETINE HCL 20 MG PO TABS: 20.0000 mg | ORAL_TABLET | Freq: Every day | ORAL | 11 refills | Status: AC

## 2018-04-08 NOTE — Progress Notes (Signed)
Frederick Hampton was here for his week 113 visit for HPTN. He said he had been noticing an increase of anxiety, depression over the past year along with panic attacks. He has complained of it before but said it was mild and would go away, but now more often and more intense. He feels like it is probably from the injections. He has also been having more intense dreams. He had taken Truvada before the study and did not notice that with the truvada, so he feels it is the cabotegravir that is causing the side effects. He denies any other outside factors that would contribute to depression. Dr. Daiva Eves saw him and they decided together to stop the injections and move him to step 3 with truvada open label daily. He was also given a prescription for paroxetine to help with depression/anxiety temporarily. He was counseled on the dosing/administration of that and how to wean off when he feels he doesn't need it anymore. I instructed him to call for any problems and he will return in 12 weeks.

## 2018-04-09 LAB — COMPREHENSIVE METABOLIC PANEL
AG Ratio: 1.7 (calc) (ref 1.0–2.5)
ALBUMIN MSPROF: 4.5 g/dL (ref 3.6–5.1)
ALKALINE PHOSPHATASE (APISO): 85 U/L (ref 40–115)
ALT: 56 U/L — ABNORMAL HIGH (ref 9–46)
AST: 37 U/L (ref 10–40)
BUN: 16 mg/dL (ref 7–25)
CALCIUM: 9.2 mg/dL (ref 8.6–10.3)
CO2: 28 mmol/L (ref 20–32)
Chloride: 106 mmol/L (ref 98–110)
Creat: 1.05 mg/dL (ref 0.60–1.35)
GLUCOSE: 88 mg/dL (ref 65–99)
Globulin: 2.7 g/dL (calc) (ref 1.9–3.7)
POTASSIUM: 4.2 mmol/L (ref 3.5–5.3)
Sodium: 141 mmol/L (ref 135–146)
TOTAL PROTEIN: 7.2 g/dL (ref 6.1–8.1)
Total Bilirubin: 1.1 mg/dL (ref 0.2–1.2)

## 2018-04-09 LAB — CBC WITH DIFFERENTIAL/PLATELET
Basophils Absolute: 19 cells/uL (ref 0–200)
Basophils Relative: 0.5 %
EOS PCT: 2.2 %
Eosinophils Absolute: 81 cells/uL (ref 15–500)
HCT: 43.7 % (ref 38.5–50.0)
Hemoglobin: 15.1 g/dL (ref 13.2–17.1)
Lymphs Abs: 1695 cells/uL (ref 850–3900)
MCH: 28.5 pg (ref 27.0–33.0)
MCHC: 34.6 g/dL (ref 32.0–36.0)
MCV: 82.5 fL (ref 80.0–100.0)
MPV: 10.3 fL (ref 7.5–12.5)
Monocytes Relative: 8.4 %
NEUTROS PCT: 43.1 %
Neutro Abs: 1595 cells/uL (ref 1500–7800)
PLATELETS: 208 10*3/uL (ref 140–400)
RBC: 5.3 10*6/uL (ref 4.20–5.80)
RDW: 13.2 % (ref 11.0–15.0)
TOTAL LYMPHOCYTE: 45.8 %
WBC mixed population: 311 cells/uL (ref 200–950)
WBC: 3.7 10*3/uL — AB (ref 3.8–10.8)

## 2018-04-09 LAB — PHOSPHORUS: Phosphorus: 3.6 mg/dL (ref 2.5–4.5)

## 2018-04-09 LAB — HIV ANTIBODY (ROUTINE TESTING W REFLEX): HIV 1&2 Ab, 4th Generation: NONREACTIVE

## 2018-04-09 LAB — CK: CK TOTAL: 195 U/L (ref 44–196)

## 2018-04-09 LAB — LIPASE: Lipase: 29 U/L (ref 7–60)

## 2018-04-09 LAB — AMYLASE: AMYLASE: 40 U/L (ref 21–101)

## 2018-04-15 ENCOUNTER — Telehealth: Payer: Self-pay | Admitting: *Deleted

## 2018-04-15 MED ORDER — ESCITALOPRAM OXALATE 10 MG PO TABS: 10.0000 mg | ORAL_TABLET | Freq: Every day | ORAL | 11 refills | Status: AC

## 2018-04-15 NOTE — Telephone Encounter (Signed)
**Note Frederick-Identified via Obfuscation** Frederick Hampton had reported intolerable side effects from the paxil and wanted to try something else. Dr. Daiva Eves said he could try lexapro (generic) at a very low dose  (1/2 tab) to start to see how it worked for him. I spoke with Frederick Hampton today and he said he would try it and probably start with 1/4 pill and gradually work up. I told him to let us know how that works for him.

## 2018-06-29 ENCOUNTER — Encounter (INDEPENDENT_AMBULATORY_CARE_PROVIDER_SITE_OTHER): Payer: Self-pay | Admitting: *Deleted

## 2018-06-29 VITALS — BP 123/82 | HR 54 | Temp 97.7°F | Wt 176.2 lb

## 2018-06-29 DIAGNOSIS — Z006 Encounter for examination for normal comparison and control in clinical research program: Secondary | ICD-10-CM

## 2018-06-29 NOTE — Progress Notes (Signed)
[name] is here for his week 12 step 3 study visit for HPTN 083 visit. He said that he recently was [facility]stat[facility], but doesn't know what he took for medication. I will request records from them. He said he missed a few pills especially when he was sick and out of work for a week with that. He denies any current problems and will return in 12 weeks for the next appt.

## 2018-06-30 LAB — HIV ANTIBODY (ROUTINE TESTING W REFLEX): HIV 1&2 Ab, 4th Generation: NONREACTIVE

## 2018-09-23 ENCOUNTER — Encounter: Payer: BLUE CROSS/BLUE SHIELD | Admitting: *Deleted

## 2018-09-30 ENCOUNTER — Encounter (INDEPENDENT_AMBULATORY_CARE_PROVIDER_SITE_OTHER): Payer: Self-pay | Admitting: *Deleted

## 2018-09-30 VITALS — BP 123/80 | HR 51 | Temp 97.9°F | Wt 176.5 lb

## 2018-09-30 DIAGNOSIS — Z006 Encounter for examination for normal comparison and control in clinical research program: Secondary | ICD-10-CM

## 2018-09-30 NOTE — Addendum Note (Signed)
Addended by: Phill Myron on: 09/30/2018 05:08 PM   Modules accepted: Orders

## 2018-10-01 LAB — LIPASE: Lipase: 47 U/L (ref 7–60)

## 2018-10-01 LAB — C. TRACHOMATIS/N. GONORRHOEAE RNA
C. TRACHOMATIS RNA, TMA: NOT DETECTED
N. GONORRHOEAE RNA, TMA: NOT DETECTED

## 2018-10-01 LAB — HIV ANTIBODY (ROUTINE TESTING W REFLEX): HIV 1&2 Ab, 4th Generation: NONREACTIVE

## 2018-10-01 LAB — COMPREHENSIVE METABOLIC PANEL
AG Ratio: 2 (calc) (ref 1.0–2.5)
ALKALINE PHOSPHATASE (APISO): 96 U/L (ref 40–115)
ALT: 52 U/L — AB (ref 9–46)
AST: 35 U/L (ref 10–40)
Albumin: 4.6 g/dL (ref 3.6–5.1)
BILIRUBIN TOTAL: 0.7 mg/dL (ref 0.2–1.2)
BUN: 14 mg/dL (ref 7–25)
CO2: 29 mmol/L (ref 20–32)
Calcium: 9.1 mg/dL (ref 8.6–10.3)
Chloride: 103 mmol/L (ref 98–110)
Creat: 0.97 mg/dL (ref 0.60–1.35)
Globulin: 2.3 g/dL (calc) (ref 1.9–3.7)
Glucose, Bld: 86 mg/dL (ref 65–99)
Potassium: 3.9 mmol/L (ref 3.5–5.3)
Sodium: 139 mmol/L (ref 135–146)
Total Protein: 6.9 g/dL (ref 6.1–8.1)

## 2018-10-01 LAB — RPR: RPR Ser Ql: NONREACTIVE

## 2018-10-01 LAB — AMYLASE: AMYLASE: 44 U/L (ref 21–101)

## 2018-10-01 LAB — CK: Total CK: 192 U/L (ref 44–196)

## 2018-10-01 LAB — PHOSPHORUS: Phosphorus: 3.5 mg/dL (ref 2.5–4.5)

## 2018-10-01 NOTE — Research (Signed)
Frederick Hampton is here for his Step 3 week 24 HPTN 083 visit. No new complaints or medications. He did not bring his study medication to visit but states he missed about 3 doses since his last visit. Rapid HIV non-reactive. He will return in January for his next study visit.

## 2018-10-03 LAB — CT/NG RNA, TMA RECTAL
Chlamydia Trachomatis RNA: NOT DETECTED
Neisseria Gonorrhoeae RNA: NOT DETECTED

## 2018-12-23 ENCOUNTER — Encounter (INDEPENDENT_AMBULATORY_CARE_PROVIDER_SITE_OTHER): Payer: Self-pay | Admitting: *Deleted

## 2018-12-23 VITALS — BP 138/87 | HR 49 | Temp 97.9°F | Wt 176.5 lb

## 2018-12-23 DIAGNOSIS — Z006 Encounter for examination for normal comparison and control in clinical research program: Secondary | ICD-10-CM

## 2018-12-23 NOTE — Research (Signed)
Frederick Hampton was here for his week 36 visit for [name] 083. He went to [name] over the [name] and developed a sinus infection and was given amoxicillin. He denies any other or current problems. He will be coming off study in 12 weeks and an appointment has been set up for him to see [name] for the PREP clinic on the same day.

## 2018-12-24 LAB — HIV ANTIBODY (ROUTINE TESTING W REFLEX): HIV 1&2 Ab, 4th Generation: NONREACTIVE

## 2019-03-09 ENCOUNTER — Telehealth: Payer: Self-pay | Admitting: Infectious Disease

## 2019-03-09 NOTE — Telephone Encounter (Signed)
COVID-19 Pre-Screening Questions: 03/09/19 °  °Do you currently have a fever (>100 °F), chills or unexplained body aches? NO ° °Are you currently experiencing new cough, shortness of breath, sore throat, runny nose? NO °•  °Have you recently travelled outside the state of [place] in the last 14 days? NO  °•  °Have you been in contact with someone that is currently pending confirmation of Covid19 testing or has been confirmed to have the Covid19 virus? NO °**If the patient answers NO to ALL questions -  advise the patient to please call the clinic before coming to the office should any symptoms develop.  ° ° ° °

## 2019-03-10 ENCOUNTER — Encounter (INDEPENDENT_AMBULATORY_CARE_PROVIDER_SITE_OTHER): Payer: Self-pay | Admitting: *Deleted

## 2019-03-10 ENCOUNTER — Ambulatory Visit (INDEPENDENT_AMBULATORY_CARE_PROVIDER_SITE_OTHER): Payer: Self-pay | Admitting: Pharmacist

## 2019-03-10 ENCOUNTER — Telehealth: Payer: Self-pay | Admitting: Pharmacy Technician

## 2019-03-10 ENCOUNTER — Other Ambulatory Visit: Payer: Self-pay

## 2019-03-10 VITALS — BP 129/88 | HR 67 | Temp 97.8°F | Wt 178.5 lb

## 2019-03-10 DIAGNOSIS — Z006 Encounter for examination for normal comparison and control in clinical research program: Secondary | ICD-10-CM

## 2019-03-10 DIAGNOSIS — Z7252 High risk homosexual behavior: Secondary | ICD-10-CM

## 2019-03-10 NOTE — Telephone Encounter (Signed)
[facility] Patient Product/process development scientist completed.    The patient in insured through [facility] and has a $0388.82 copay.  We will continue to follow to offer copay assistance.  Netty Starring. Dimas Aguas CPhT Specialty Pharmacy Patient [facility] Phone: [phone] Fax:  [phone]

## 2019-03-10 NOTE — Progress Notes (Signed)
**Note Frederick-Identified via Obfuscation** Date:  03/10/2019   HPI: Frederick Hampton is a 43 y.o. male who presents to the [facility] clinic for PrEP follow-up.  Insured   [x]    Uninsured  []    Patient Active Problem List   Diagnosis Date Noted  . Seasonal allergies     Patient's Medications  New Prescriptions   No medications on file  Previous Medications   DICLOFENAC SODIUM (VOLTAREN) 1 % GEL    APPLY 2 GRAMS FOUR TIMES DAILY AS NEEDED FOR PAIN   ESCITALOPRAM (LEXAPRO) 10 MG TABLET    Take 1 tablet (10 mg total) by mouth daily. Titrate dose up as tolerated, start with 1/2 pill 5mg .   FLUTICASONE (FLONASE) 50 MCG/ACT NASAL SPRAY    Place 1 spray daily into both nostrils.   OMEPRAZOLE (PRILOSEC) 20 MG CAPSULE    Take 1 capsule (20 mg total) by mouth daily.   PAROXETINE (PAXIL) 20 MG TABLET    Take 1 tablet (20 mg total) by mouth daily.   RESEARCH STUDY MEDICATION    TDF/FTC 500mg  or Placebo Take one tablet by mouth daily with or without food  THIS IS A STUDY DISPENSED MEDICATION AND MAY BE PLACEBO-DO NOT FILL-   RESEARCH STUDY MEDICATION    Cabotegravir 30mg  or Placebo Take one tablet by mouth daily with or without food  THIS IS A STUDY DISPENSED MEDICATION AND MAY BE PLACEBO-DO NOT FILL-  Modified Medications   No medications on file  Discontinued Medications   No medications on file    Allergies: No Known Allergies  Past Medical History: Past Medical History:  Diagnosis Date  . HTN (hypertension)   . Seasonal allergies     Social History: Social History   Socioeconomic History  . Marital status: Married    Spouse name: Not on file  . Number of children: Not on file  . Years of education: Not on file  . Highest education level: Not on file  Occupational History  . Not on file  Social Needs  . Financial resource strain: Not on file  . Food insecurity:    Worry: Not on file    Inability: Not on file  . Transportation needs:    Medical: Not on file    Non-medical: Not on file  Tobacco Use  . Smoking  status: Current Some Day Smoker    Types: Cigarettes    Start date: 12/02/2005  . Tobacco comment: 1 cigarrette per day  Substance and Sexual Activity  . Alcohol use: Yes    Alcohol/week: 1.0 standard drinks    Types: 1 Standard drinks or equivalent per week  . Drug use: Not on file  . Sexual activity: Not on file  Lifestyle  . Physical activity:    Days per week: Not on file    Minutes per session: Not on file  . Stress: Not on file  Relationships  . Social connections:    Talks on phone: Not on file    Gets together: Not on file    Attends religious service: Not on file    Active member of club or organization: Not on file    Attends meetings of clubs or organizations: Not on file    Relationship status: Not on file  Other Topics Concern  . Not on file  Social History Narrative  . Not on file    No flowsheet data found.  Labs:  SCr: Lab Results  Component Value Date   CREATININE 0.97 09/30/2018  CREATININE 1.05 04/08/2018   CREATININE 1.00 02/17/2018   CREATININE 1.10 02/03/2018   CREATININE 1.00 12/29/2017   HIV Lab Results  Component Value Date   HIV NON-REACTIVE 12/23/2018   HIV NON-REACTIVE 09/30/2018   HIV NON-REACTIVE 06/29/2018   HIV NON-REACTIVE 04/08/2018   HIV NON-REACTIVE 02/17/2018   Hepatitis B Lab Results  Component Value Date   HEPBSAB POS (A) 01/31/2016   HEPBSAG NEGATIVE 01/24/2016   HEPBCAB NON REACTIVE 01/31/2016   Hepatitis C Lab Results  Component Value Date   HEPCAB NON-REACTIVE 02/03/2018   Hepatitis A No results found for: HAV RPR and STI Lab Results  Component Value Date   LABRPR NON-REACTIVE 09/30/2018   LABRPR NON-REACTIVE 02/03/2018   LABRPR NON-REACTIVE 08/20/2017   LABRPR NON REAC 03/03/2017   LABRPR NON REAC 09/18/2016    STI Results GC CT  03/03/2017 NOT DETECTED NOT DETECTED  09/18/2016 NOT DETECTED NOT DETECTED  01/31/2016 NOT DETECTED NOT DETECTED    Assessment: Frederick Hampton is here today to see research for  PrEP.  This is his last research visit and he will be transitioning to me for PrEP going forward.  He will have labs drawn today through research.  He has about a bottle left of Truvada, so I will wait to send a refill until he calls me when he has about 6-7 pills left.  I will transition him to [place]cov[place] at his next visit with me.  Gave him my card to call me with any questions or concerns.  I will see him in 3 months.  Plan: - Continue Truvada PO once daily - Call me when you have 6-7 pills remaining  [name] L. Kuppelweiser, PharmD, BCIDP, AAHIVP, CPP Infectious Diseases Clinical Pharmacist [facility] 03/10/2019, 4:32 PM

## 2019-03-10 NOTE — Research (Signed)
Frederick Hampton is here for his final study visit [place]. No new complaints or concerns. He did not return his study medication but verbalized excellent adherence. Rapid HIV non-reactive. He met with [name] and plans to transition to the PrEP clinic. He will return in July for his next visit with pharmacy.

## 2019-03-11 LAB — PHOSPHORUS: Phosphorus: 2.4 mg/dL — ABNORMAL LOW (ref 2.5–4.5)

## 2019-03-11 LAB — COMPREHENSIVE METABOLIC PANEL
AG Ratio: 2.2 (calc) (ref 1.0–2.5)
ALT: 63 U/L — ABNORMAL HIGH (ref 9–46)
AST: 37 U/L (ref 10–40)
Albumin: 4.8 g/dL (ref 3.6–5.1)
Alkaline phosphatase (APISO): 79 U/L (ref 36–130)
BUN: 11 mg/dL (ref 7–25)
CO2: 27 mmol/L (ref 20–32)
Calcium: 9.2 mg/dL (ref 8.6–10.3)
Chloride: 104 mmol/L (ref 98–110)
Creat: 0.99 mg/dL (ref 0.60–1.35)
Globulin: 2.2 g/dL (calc) (ref 1.9–3.7)
Glucose, Bld: 112 mg/dL — ABNORMAL HIGH (ref 65–99)
Potassium: 3.7 mmol/L (ref 3.5–5.3)
Sodium: 138 mmol/L (ref 135–146)
Total Bilirubin: 0.8 mg/dL (ref 0.2–1.2)
Total Protein: 7 g/dL (ref 6.1–8.1)

## 2019-03-11 LAB — RPR: RPR Ser Ql: NONREACTIVE

## 2019-03-11 LAB — AMYLASE: Amylase: 47 U/L (ref 21–101)

## 2019-03-11 LAB — HIV ANTIBODY (ROUTINE TESTING W REFLEX): HIV 1&2 Ab, 4th Generation: NONREACTIVE

## 2019-03-11 LAB — C. TRACHOMATIS/N. GONORRHOEAE RNA
C. trachomatis RNA, TMA: NOT DETECTED
N. gonorrhoeae RNA, TMA: NOT DETECTED

## 2019-03-11 LAB — CK: Total CK: 229 U/L — ABNORMAL HIGH (ref 44–196)

## 2019-03-11 LAB — LIPASE: Lipase: 26 U/L (ref 7–60)

## 2019-03-12 LAB — CT/NG RNA, TMA RECTAL
Chlamydia Trachomatis RNA: NOT DETECTED
Neisseria Gonorrhoeae RNA: NOT DETECTED

## 2019-06-07 ENCOUNTER — Ambulatory Visit: Payer: Self-pay | Admitting: Pharmacist

## 2019-08-24 ENCOUNTER — Other Ambulatory Visit: Payer: Self-pay

## 2021-07-09 ENCOUNTER — Other Ambulatory Visit: Payer: Self-pay

## 2021-07-09 ENCOUNTER — Other Ambulatory Visit (HOSPITAL_COMMUNITY): Payer: Self-pay

## 2021-07-09 ENCOUNTER — Ambulatory Visit (INDEPENDENT_AMBULATORY_CARE_PROVIDER_SITE_OTHER): Payer: 59 | Admitting: Pharmacist

## 2021-07-09 ENCOUNTER — Telehealth: Payer: Self-pay

## 2021-07-09 DIAGNOSIS — Z7252 High risk homosexual behavior: Secondary | ICD-10-CM | POA: Diagnosis not present

## 2021-07-09 NOTE — Progress Notes (Signed)
Date:  07/09/2021   HPI: Frederick Hampton is a 45 y.o. male who presents to the [facility] pharmacy clinic to discuss and initiate PrEP.  Insured   [x]    Uninsured  []    Patient Active Problem List   Diagnosis Date Noted   Seasonal allergies     Patient's Medications  New Prescriptions   No medications on file  Previous Medications   DICLOFENAC SODIUM (VOLTAREN) 1 % GEL    APPLY 2 GRAMS FOUR TIMES DAILY AS NEEDED FOR PAIN   ESCITALOPRAM (LEXAPRO) 10 MG TABLET    Take 1 tablet (10 mg total) by mouth daily. Titrate dose up as tolerated, start with 1/2 pill 5mg .   FLUTICASONE (FLONASE) 50 MCG/ACT NASAL SPRAY    Place 1 spray daily into both nostrils.   OMEPRAZOLE (PRILOSEC) 20 MG CAPSULE    Take 1 capsule (20 mg total) by mouth daily.   PAROXETINE (PAXIL) 20 MG TABLET    Take 1 tablet (20 mg total) by mouth daily.   RESEARCH STUDY MEDICATION    TDF/FTC 500mg  or Placebo Take one tablet by mouth daily with or without food  THIS IS A STUDY DISPENSED MEDICATION AND MAY BE PLACEBO-DO NOT FILL-   RESEARCH STUDY MEDICATION    Cabotegravir 30mg  or Placebo Take one tablet by mouth daily with or without food  THIS IS A STUDY DISPENSED MEDICATION AND MAY BE PLACEBO-DO NOT FILL-  Modified Medications   No medications on file  Discontinued Medications   No medications on file    Allergies: No Known Allergies  Past Medical History: Past Medical History:  Diagnosis Date   HTN (hypertension)    Seasonal allergies     Social History: Social History   Socioeconomic History   Marital status: Married    Spouse name: Not on file   Number of children: Not on file   Years of education: Not on file   Highest education level: Not on file  Occupational History   Not on file  Tobacco Use   Smoking status: Some Days    Types: Cigarettes    Start date: 12/02/2005   Smokeless tobacco: Not on file   Tobacco comments:    1 cigarrette per day  Substance and Sexual Activity   Alcohol use: Yes     Alcohol/week: 1.0 standard drink    Types: 1 Standard drinks or equivalent per week   Drug use: Not on file   Sexual activity: Not on file  Other Topics Concern   Not on file  Social History Narrative   Not on file   Social Determinants of Health   Financial Resource Strain: Not on file  Food Insecurity: Not on file  Transportation Needs: Not on file  Physical Activity: Not on file  Stress: Not on file  Social Connections: Not on file    No flowsheet data found.  Labs:  SCr: Lab Results  Component Value Date   CREATININE 0.99 03/10/2019   CREATININE 0.97 09/30/2018   CREATININE 1.05 04/08/2018   CREATININE 1.00 02/17/2018   CREATININE 1.10 02/03/2018   HIV Lab Results  Component Value Date   HIV NON-REACTIVE 03/10/2019   HIV NON-REACTIVE 12/23/2018   HIV NON-REACTIVE 09/30/2018   HIV NON-REACTIVE 06/29/2018   HIV NON-REACTIVE 04/08/2018   Hepatitis B Lab Results  Component Value Date   HEPBSAB POS (A) 01/31/2016   HEPBSAG NEGATIVE 01/24/2016   HEPBCAB NON REACTIVE 01/31/2016   Hepatitis C Lab Results  Component  Value Date   HEPCAB NON-REACTIVE 02/03/2018   Hepatitis A No results found for: HAV RPR and STI Lab Results  Component Value Date   LABRPR NON-REACTIVE 03/10/2019   LABRPR NON-REACTIVE 09/30/2018   LABRPR NON-REACTIVE 02/03/2018   LABRPR NON-REACTIVE 08/20/2017   LABRPR NON REAC 03/03/2017    STI Results GC CT  03/03/2017 NOT DETECTED NOT DETECTED  09/18/2016 NOT DETECTED NOT DETECTED  01/31/2016 NOT DETECTED NOT DETECTED    Assessment: [name] presents to clinic today to initiate PrEP. He previously saw [name] and the research team and received both Apretude and Truvada. He stated he experienced "insanity and feeling down" while on Apretude, so he was switched to Truvada. He stated he stopped Truvada after 2-3 months due to only being in a relationship with one HIV-negative sexual partner for 2 years. He last took Truvada in April 2020. Now he  and his partner are interested in an open relationship, so he wanted to restart PrEP. He is not interested in Apretude due to previous side effects. Although is Truvada is covered through his insurance, he is fearful of lab/appointment copays. After discussion with the research team and Demonta, Wombles will hold off on following our PrEP clinic for now. He is not eligible for the PURPOSE-2 study as he has been on an investigational HIV therapy (Apretude) in the past.   We discussed that he may be able to see his PCP for PrEP if his PCP is willing to treat him for it. This would reduce the number of appointments he has and also likely reduce the number of labs he needs. Will not schedule a future appointment as he investigates this option. I gave him my card and told him to call or message me on MyChart with any questions or concerns.     Plan:  Follow-up with PCP about PrEP visits  Margarite Gouge, PharmD, CPP Clinical Pharmacist Practitioner Infectious Diseases Clinical Pharmacist [facility] 07/09/2021, 3:29 PM

## 2021-07-09 NOTE — Telephone Encounter (Signed)
[facility] Patient Product/process development scientist completed.    The patient is insured through [employer].  Truvada $0.00 Descovy -non formulary  Apretude - Medical Benefits    We will continue to follow to see if copay assistance is needed.  Clearance Coots, CPhT Specialty Pharmacy Patient [facility] Phone: [phone] Fax:  [phone]

## 2024-05-31 ENCOUNTER — Other Ambulatory Visit (HOSPITAL_COMMUNITY): Payer: Self-pay

## 2024-05-31 ENCOUNTER — Telehealth: Payer: Self-pay

## 2024-05-31 NOTE — Telephone Encounter (Signed)
 Pharmacy Patient Advocate Encounter  Insurance verification completed.   The patient is insured through [facility]   Ran test claim for Descovy. Currently a quantity of 30 is a 30 day supply and the co-pay is $0.00 .  Apretude will need a PA and we will need to see insurance card.  This test claim was processed through [facility]- copay amounts may vary at other pharmacies due to pharmacy/plan contracts, or as the patient moves through the different stages of their insurance plan.

## 2024-06-02 NOTE — Progress Notes (Deleted)
 NEW REFERRAL TO CPP CLINIC    Date:  06/02/2024   HPI: Frederick Hampton is a 48 y.o. male who presents to the [facility] pharmacy clinic to discuss and initiate PrEP.  Insured   [x]    Uninsured  []    Patient Active Problem List   Diagnosis Date Noted   Seasonal allergies     Patient's Medications  New Prescriptions   No medications on file  Previous Medications   DICLOFENAC  SODIUM (VOLTAREN ) 1 % GEL    APPLY 2 GRAMS FOUR TIMES DAILY AS NEEDED FOR PAIN   ESCITALOPRAM  (LEXAPRO ) 10 MG TABLET    Take 1 tablet (10 mg total) by mouth daily. Titrate dose up as tolerated, start with 1/2 pill 5mg .   FLUTICASONE  (FLONASE ) 50 MCG/ACT NASAL SPRAY    Place 1 spray daily into both nostrils.   OMEPRAZOLE  (PRILOSEC) 20 MG CAPSULE    Take 1 capsule (20 mg total) by mouth daily.   PAROXETINE  (PAXIL ) 20 MG TABLET    Take 1 tablet (20 mg total) by mouth daily.   RESEARCH STUDY MEDICATION    TDF/FTC 500mg  or Placebo Take one tablet by mouth daily with or without food  THIS IS A STUDY DISPENSED MEDICATION AND MAY BE PLACEBO-DO NOT FILL-   RESEARCH STUDY MEDICATION    Cabotegravir 30mg  or Placebo Take one tablet by mouth daily with or without food  THIS IS A STUDY DISPENSED MEDICATION AND MAY BE PLACEBO-DO NOT FILL-  Modified Medications   No medications on file  Discontinued Medications   No medications on file    Allergies: No Known Allergies  Past Medical History: Past Medical History:  Diagnosis Date   HTN (hypertension)    Seasonal allergies     Social History: Social History   Socioeconomic History   Marital status: Married    Spouse name: Not on file   Number of children: Not on file   Years of education: Not on file   Highest education level: Not on file  Occupational History   Not on file  Tobacco Use   Smoking status: Some Days    Types: Cigarettes    Start date: 12/02/2005   Smokeless tobacco: Not on file   Tobacco comments:    1 cigarrette per day  Substance and Sexual  Activity   Alcohol use: Yes    Alcohol/week: 1.0 standard drink of alcohol    Types: 1 Standard drinks or equivalent per week   Drug use: Not on file   Sexual activity: Not on file  Other Topics Concern   Not on file  Social History Narrative   Not on file   Social Drivers of Health   Financial Resource Strain: Not on file  Food Insecurity: Not on file  Transportation Needs: Not on file  Physical Activity: Not on file  Stress: Not on file  Social Connections: Unknown (04/15/2022)   Received from [facility]   Social Network    Social Network: Not on file        No data to display          Labs:  SCr: Lab Results  Component Value Date   CREATININE 0.99 03/10/2019   CREATININE 0.97 09/30/2018   CREATININE 1.05 04/08/2018   CREATININE 1.00 02/17/2018   CREATININE 1.10 02/03/2018   HIV Lab Results  Component Value Date   HIV NON-REACTIVE 03/10/2019   HIV NON-REACTIVE 12/23/2018   HIV NON-REACTIVE 09/30/2018   HIV NON-REACTIVE 06/29/2018  HIV NON-REACTIVE 04/08/2018   Hepatitis B Lab Results  Component Value Date   HEPBSAB POS (A) 01/31/2016   HEPBSAG NEGATIVE 01/24/2016   HEPBCAB NON REACTIVE 01/31/2016   Hepatitis C Lab Results  Component Value Date   HEPCAB NON-REACTIVE 02/03/2018   Hepatitis A No results found for: HAV RPR and STI Lab Results  Component Value Date   LABRPR NON-REACTIVE 03/10/2019   LABRPR NON-REACTIVE 09/30/2018   LABRPR NON-REACTIVE 02/03/2018   LABRPR NON-REACTIVE 08/20/2017   LABRPR NON REAC 03/03/2017    STI Results GC CT  03/03/2017  8:15 AM NOT DETECTED  NOT DETECTED   09/18/2016  4:30 PM NOT DETECTED  NOT DETECTED   01/31/2016  5:15 PM NOT DETECTED  NOT DETECTED     Assessment: Frederick Hampton presents to clinic today to initiate PrEP. Previously followed our research department and received Truvada and Apretude. Did not tolerate Apretude but would like to resume oral therapy. Descovy is covered no charge through his  insurance. Will check baseline HIV antibody test and fill *** once results return negative. Other baseline labs including CMP and lipid profile were recently assessed through [facility] last week. Hepatitis serologies were updated on 2020. Will check baseline RPR and cytologies today.   Unfortunately, he is ineligible for HPV as he is over the age cut off. Currently up-to-date on vaccines.   Plan: - Check HIV antibody, RPR, and *** cytologies - Start *** if HIV antibody negative - Follow up with *** on ***   Alan Geralds, PharmD, CPP, BCIDP, AAHIVP Clinical Pharmacist Practitioner Infectious Diseases Clinical Pharmacist [facility] 06/02/2024, 3:54 PM

## 2024-06-03 ENCOUNTER — Ambulatory Visit: Admitting: Pharmacist

## 2024-06-03 DIAGNOSIS — Z113 Encounter for screening for infections with a predominantly sexual mode of transmission: Secondary | ICD-10-CM

## 2024-06-03 DIAGNOSIS — Z79899 Other long term (current) drug therapy: Secondary | ICD-10-CM

## 2024-06-04 NOTE — Progress Notes (Signed)
 NEW REFERRAL TO CPP CLINIC    Date:  06/04/2024   HPI: Frederick Hampton is a 48 y.o. male who presents to the [facility] pharmacy clinic to discuss and initiate PrEP.  Insured   [x]    Uninsured  []    Patient Active Problem List   Diagnosis Date Noted   Seasonal allergies     Patient's Medications  New Prescriptions   No medications on file  Previous Medications   DICLOFENAC  SODIUM (VOLTAREN ) 1 % GEL    APPLY 2 GRAMS FOUR TIMES DAILY AS NEEDED FOR PAIN   ESCITALOPRAM  (LEXAPRO ) 10 MG TABLET    Take 1 tablet (10 mg total) by mouth daily. Titrate dose up as tolerated, start with 1/2 pill 5mg .   FLUTICASONE  (FLONASE ) 50 MCG/ACT NASAL SPRAY    Place 1 spray daily into both nostrils.   OMEPRAZOLE  (PRILOSEC) 20 MG CAPSULE    Take 1 capsule (20 mg total) by mouth daily.   PAROXETINE  (PAXIL ) 20 MG TABLET    Take 1 tablet (20 mg total) by mouth daily.   RESEARCH STUDY MEDICATION    TDF/FTC 500mg  or Placebo Take one tablet by mouth daily with or without food  THIS IS A STUDY DISPENSED MEDICATION AND MAY BE PLACEBO-DO NOT FILL-   RESEARCH STUDY MEDICATION    Cabotegravir 30mg  or Placebo Take one tablet by mouth daily with or without food  THIS IS A STUDY DISPENSED MEDICATION AND MAY BE PLACEBO-DO NOT FILL-  Modified Medications   No medications on file  Discontinued Medications   No medications on file    Allergies: No Known Allergies  Past Medical History: Past Medical History:  Diagnosis Date   HTN (hypertension)    Seasonal allergies     Social History: Social History   Socioeconomic History   Marital status: Married    Spouse name: Not on file   Number of children: Not on file   Years of education: Not on file   Highest education level: Not on file  Occupational History   Not on file  Tobacco Use   Smoking status: Some Days    Types: Cigarettes    Start date: 12/02/2005   Smokeless tobacco: Not on file   Tobacco comments:    1 cigarrette per day  Substance and Sexual  Activity   Alcohol use: Yes    Alcohol/week: 1.0 standard drink of alcohol    Types: 1 Standard drinks or equivalent per week   Drug use: Not on file   Sexual activity: Not on file  Other Topics Concern   Not on file  Social History Narrative   Not on file   Social Drivers of Health   Financial Resource Strain: Not on file  Food Insecurity: Not on file  Transportation Needs: Not on file  Physical Activity: Not on file  Stress: Not on file  Social Connections: Unknown (04/15/2022)   Received from [facility]   Social Network    Social Network: Not on file        No data to display          Labs:  SCr: Lab Results  Component Value Date   CREATININE 0.99 03/10/2019   CREATININE 0.97 09/30/2018   CREATININE 1.05 04/08/2018   CREATININE 1.00 02/17/2018   CREATININE 1.10 02/03/2018   HIV Lab Results  Component Value Date   HIV NON-REACTIVE 03/10/2019   HIV NON-REACTIVE 12/23/2018   HIV NON-REACTIVE 09/30/2018   HIV NON-REACTIVE 06/29/2018  HIV NON-REACTIVE 04/08/2018   Hepatitis B Lab Results  Component Value Date   HEPBSAB POS (A) 01/31/2016   HEPBSAG NEGATIVE 01/24/2016   HEPBCAB NON REACTIVE 01/31/2016   Hepatitis C Lab Results  Component Value Date   HEPCAB NON-REACTIVE 02/03/2018   Hepatitis A No results found for: HAV RPR and STI Lab Results  Component Value Date   LABRPR NON-REACTIVE 03/10/2019   LABRPR NON-REACTIVE 09/30/2018   LABRPR NON-REACTIVE 02/03/2018   LABRPR NON-REACTIVE 08/20/2017   LABRPR NON REAC 03/03/2017    STI Results GC CT  03/03/2017  8:15 AM NOT DETECTED  NOT DETECTED   09/18/2016  4:30 PM NOT DETECTED  NOT DETECTED   01/31/2016  5:15 PM NOT DETECTED  NOT DETECTED     Assessment: Crisanto presents to clinic today to initiate PrEP. States he is sexually active with men only and participates in insertive and receptive rectal sex along with oral sex. States he mostly uses condom but has been diagnosed with STIs in the  past. Previously followed our research department and received Truvada and Apretude. States he was removed from the trial when he experienced depression after starting Apretude. After discussing his options, he would like to retry Apretude again. Will check baseline HIV antibody test and start Apretude PA today.  No other baseline labs required. Hepatitis serologies were updated in 2020.   States his RPR and urine/oral/rectal cytologies were recently assessed last week through [facility] as one of his prior partners contracted syphilis. He is unsure of his results but has received 2/3 Bicillin doses with them; due for 3/3 doses on Monday. Will defer STI testing as he has not been active since this testing last week. He also inquired about doxyPEP today. Reviewed that this is one tool we have to prevent STIs alongside other tools such as condom use. Counseled to take TWO doxycycline  tablets within 72 hours but ideally within 24 hours of condomless sex. Educated patient on the importance of taking with a full meal to prevent stomach upset and sitting up for at least one hour following administration. Patient verbalized understanding. Will send prescription to preferred pharmacy.   Unfortunately, he is ineligible for HPV as he is over the age cut-off. Currently up-to-date on vaccines.   Plan: - Check HIV antibody - Start Apretude PA - Prescribe doxyPEP as needed  - Follow up once Apretude is approved  Alan Geralds, PharmD, CPP, BCIDP, AAHIVP Clinical Pharmacist Practitioner Infectious Diseases Clinical Pharmacist [facility] 06/04/2024, 4:07 PM

## 2024-06-10 ENCOUNTER — Other Ambulatory Visit (HOSPITAL_COMMUNITY): Payer: Self-pay

## 2024-06-10 ENCOUNTER — Ambulatory Visit (INDEPENDENT_AMBULATORY_CARE_PROVIDER_SITE_OTHER): Admitting: Pharmacist

## 2024-06-10 ENCOUNTER — Other Ambulatory Visit: Payer: Self-pay

## 2024-06-10 DIAGNOSIS — Z113 Encounter for screening for infections with a predominantly sexual mode of transmission: Secondary | ICD-10-CM

## 2024-06-10 DIAGNOSIS — Z79899 Other long term (current) drug therapy: Secondary | ICD-10-CM

## 2024-06-10 MED ORDER — DOXYCYCLINE HYCLATE 100 MG PO TABS
ORAL_TABLET | ORAL | 0 refills | Status: AC
Start: 2024-06-10 — End: ?

## 2024-06-11 ENCOUNTER — Telehealth: Payer: Self-pay

## 2024-06-11 LAB — HIV ANTIBODY (ROUTINE TESTING W REFLEX): HIV 1&2 Ab, 4th Generation: NONREACTIVE

## 2024-06-11 NOTE — Telephone Encounter (Addendum)
 Pharmacy Patient Advocate Encounter- Apretude  BIV-Medical Benefit:  J code: [id number] CPT code: 03627  Dx Code: Z72.53  NO PA is required through [facility] . Authorization# [id number]  Notification will be in Media     [employer] Portal Member ID- [id number] BIN [id number] PCN- [id number] Group # - [id number]

## 2024-06-11 NOTE — Telephone Encounter (Addendum)
 Scheduled for first injection next Wednesday - thank you!

## 2024-06-14 NOTE — Progress Notes (Unsigned)
 HPI: Frederick Hampton is a 48 y.o. male who presents to the [facility] pharmacy clinic for Apretude  administration and HIV PrEP follow up.  Patient Active Problem List   Diagnosis Date Noted   Seasonal allergies     Patient's Medications  New Prescriptions   No medications on file  Previous Medications   DICLOFENAC  SODIUM (VOLTAREN ) 1 % GEL    APPLY 2 GRAMS FOUR TIMES DAILY AS NEEDED FOR PAIN   DOXYCYCLINE  (VIBRA -TABS) 100 MG TABLET    Take 2 tablets (200 mg) by mouth 24-72 hours after condomless sex.   ESCITALOPRAM  (LEXAPRO ) 10 MG TABLET    Take 1 tablet (10 mg total) by mouth daily. Titrate dose up as tolerated, start with 1/2 pill 5mg .   FLUTICASONE  (FLONASE ) 50 MCG/ACT NASAL SPRAY    Place 1 spray daily into both nostrils.   OMEPRAZOLE  (PRILOSEC) 20 MG CAPSULE    Take 1 capsule (20 mg total) by mouth daily.   PAROXETINE  (PAXIL ) 20 MG TABLET    Take 1 tablet (20 mg total) by mouth daily.   RESEARCH STUDY MEDICATION    TDF/FTC 500mg  or Placebo Take one tablet by mouth daily with or without food  THIS IS A STUDY DISPENSED MEDICATION AND MAY BE PLACEBO-DO NOT FILL-   RESEARCH STUDY MEDICATION    Cabotegravir  30mg  or Placebo Take one tablet by mouth daily with or without food  THIS IS A STUDY DISPENSED MEDICATION AND MAY BE PLACEBO-DO NOT FILL-  Modified Medications   No medications on file  Discontinued Medications   No medications on file    Allergies: No Known Allergies  Past Medical History: Past Medical History:  Diagnosis Date   HTN (hypertension)    Seasonal allergies     Social History: Social History   Socioeconomic History   Marital status: Married    Spouse name: Not on file   Number of children: Not on file   Years of education: Not on file   Highest education level: Not on file  Occupational History   Not on file  Tobacco Use   Smoking status: Some Days    Types: Cigarettes    Start date: 12/02/2005   Smokeless tobacco: Not on file   Tobacco comments:     1 cigarrette per day  Substance and Sexual Activity   Alcohol use: Yes    Alcohol/week: 1.0 standard drink of alcohol    Types: 1 Standard drinks or equivalent per week   Drug use: Not on file   Sexual activity: Not on file  Other Topics Concern   Not on file  Social History Narrative   Not on file   Social Drivers of Health   Financial Resource Strain: Not on file  Food Insecurity: Not on file  Transportation Needs: Not on file  Physical Activity: Not on file  Stress: Not on file  Social Connections: Unknown (04/15/2022)   Received from [facility]   Social Network    Social Network: Not on file    Labs: Lab Results  Component Value Date   HIV1RNAQUANT <20 01/24/2016    RPR and STI Lab Results  Component Value Date   LABRPR NON-REACTIVE 03/10/2019   LABRPR NON-REACTIVE 09/30/2018   LABRPR NON-REACTIVE 02/03/2018   LABRPR NON-REACTIVE 08/20/2017   LABRPR NON REAC 03/03/2017    STI Results GC CT  03/03/2017  8:15 AM NOT DETECTED  NOT DETECTED   09/18/2016  4:30 PM NOT DETECTED  NOT DETECTED  01/31/2016  5:15 PM NOT DETECTED  NOT DETECTED     Hepatitis B Lab Results  Component Value Date   HEPBSAB POS (A) 01/31/2016   HEPBSAG NEGATIVE 01/24/2016   HEPBCAB NON REACTIVE 01/31/2016   Hepatitis C Lab Results  Component Value Date   HEPCAB NON-REACTIVE 02/03/2018   Hepatitis A No results found for: HAV Lipids: Lab Results  Component Value Date   CHOL 202 (H) 02/03/2018   TRIG 334 (H) 02/03/2018   HDL 37 (L) 02/03/2018   CHOLHDL 5.5 (H) 02/03/2018   VLDL 65 (H) 03/03/2017   LDLCALC 117 (H) 02/03/2018    TARGET DATE: The 16th of the month  Assessment: [name] presents today for their first initiation injection of Apretude  and to follow up for HIV PrEP.  Counseled that Apretude  is one intramuscular injection in the gluteal muscle for each visit. Explained that the second injection is 30 days after the initial injection then every 2 months  thereafter. Discussed follow up appointments moving forward. Screened patient for acute HIV symptoms such as fatigue, muscle aches, rash, sore throat, lymphadenopathy, headache, night sweats, nausea/vomiting/diarrhea, and fever. Patient denies any symptoms.   Explained that showing up to injection appointments is very important and warned that if appointments are missed, protection will be minimal and the risk of acquiring HIV becomes much higher. Counseled on possible side effects associated with the injections such as injection site pain, which is usually mild to moderate in nature, injection site nodules, and injection site reactions. Asked to call the clinic or send me a mychart message if they experience any issues. Advised that they can take Motrin or Tylenol for injection site pain if needed. They may also pre-treat with Motrin or Tylenol about 30-45 minutes before scheduled appointments.    Deferring HIV RNA today as patient recently tested negative for HIV on 06/10/24 and has not been sexually active since then.   Administered cabotegravir  600mg /17mL in right upper outer quadrant of the gluteal muscle. Monitored patient for 10 minutes after injection. Injections were tolerated well without issue. Counseled to call with any issues that may arise. Will make follow up appointments for second initiation injection in 30 days and then maintenance injections every 2 months thereafter.   Plan: - First Apretude  injection administered - Second initiation injection scheduled for 8/13 with me  - Maintenance injections scheduled for 10/9 with me  - Call with any issues or questions  Alan Geralds, PharmD, CPP, BCIDP, AAHIVP Clinical Pharmacist Practitioner Infectious Diseases Clinical Pharmacist [facility]

## 2024-06-16 ENCOUNTER — Ambulatory Visit: Admitting: Pharmacist

## 2024-06-16 ENCOUNTER — Other Ambulatory Visit: Payer: Self-pay

## 2024-06-16 DIAGNOSIS — Z79899 Other long term (current) drug therapy: Secondary | ICD-10-CM

## 2024-06-16 DIAGNOSIS — Z2981 Encounter for HIV pre-exposure prophylaxis: Secondary | ICD-10-CM

## 2024-06-16 MED ORDER — APRETUDE 600 MG/3ML IM SUER: 600.0000 mg | INTRAMUSCULAR | Status: AC

## 2024-06-16 MED ORDER — CABOTEGRAVIR ER 600 MG/3ML IM SUER
600.0000 mg | Freq: Once | INTRAMUSCULAR | Status: AC
  Administered 2024-06-16: 600 mg via INTRAMUSCULAR

## 2024-06-18 LAB — HIV-1 RNA QUANT-NO REFLEX-BLD
HIV 1 RNA Quant: NOT DETECTED {copies}/mL
HIV-1 RNA Quant, Log: NOT DETECTED {Log_copies}/mL

## 2024-07-12 ENCOUNTER — Other Ambulatory Visit (HOSPITAL_COMMUNITY): Payer: Self-pay

## 2024-07-12 NOTE — Progress Notes (Signed)
 HPI: Frederick Hampton is a 48 y.o. male who presents to the [facility] clinic for Apretude  administration and HIV PrEP follow up.  Patient Active Problem List   Diagnosis Date Noted   Seasonal allergies     Patient's Medications  New Prescriptions   No medications on file  Previous Medications   CABOTEGRAVIR  ER (APRETUDE ) 600 MG/3ML INJECTION    Inject 3 mLs (600 mg total) into the muscle every 30 (thirty) days.   DICLOFENAC  SODIUM (VOLTAREN ) 1 % GEL    APPLY 2 GRAMS FOUR TIMES DAILY AS NEEDED FOR PAIN   DOXYCYCLINE  (VIBRA -TABS) 100 MG TABLET    Take 2 tablets (200 mg) by mouth 24-72 hours after condomless sex.   ESCITALOPRAM  (LEXAPRO ) 10 MG TABLET    Take 1 tablet (10 mg total) by mouth daily. Titrate dose up as tolerated, start with 1/2 pill 5mg .   FLUTICASONE  (FLONASE ) 50 MCG/ACT NASAL SPRAY    Place 1 spray daily into both nostrils.   OMEPRAZOLE  (PRILOSEC) 20 MG CAPSULE    Take 1 capsule (20 mg total) by mouth daily.   PAROXETINE  (PAXIL ) 20 MG TABLET    Take 1 tablet (20 mg total) by mouth daily.   RESEARCH STUDY MEDICATION    TDF/FTC 500mg  or Placebo Take one tablet by mouth daily with or without food  THIS IS A STUDY DISPENSED MEDICATION AND MAY BE PLACEBO-DO NOT FILL-   RESEARCH STUDY MEDICATION    Cabotegravir  30mg  or Placebo Take one tablet by mouth daily with or without food  THIS IS A STUDY DISPENSED MEDICATION AND MAY BE PLACEBO-DO NOT FILL-  Modified Medications   No medications on file  Discontinued Medications   No medications on file    Allergies: No Known Allergies  Past Medical History: Past Medical History:  Diagnosis Date   HTN (hypertension)    Seasonal allergies     Social History: Social History   Socioeconomic History   Marital status: Married    Spouse name: Not on file   Number of children: Not on file   Years of education: Not on file   Highest education level: Not on file  Occupational History   Not on file  Tobacco Use   Smoking  status: Some Days    Types: Cigarettes    Start date: 12/02/2005   Smokeless tobacco: Not on file   Tobacco comments:    1 cigarrette per day  Substance and Sexual Activity   Alcohol use: Yes    Alcohol/week: 1.0 standard drink of alcohol    Types: 1 Standard drinks or equivalent per week   Drug use: Not on file   Sexual activity: Not on file  Other Topics Concern   Not on file  Social History Narrative   Not on file   Social Drivers of Health   Financial Resource Strain: Not on file  Food Insecurity: Not on file  Transportation Needs: Not on file  Physical Activity: Not on file  Stress: Not on file  Social Connections: Unknown (04/15/2022)   Received from [facility]   Social Network    Social Network: Not on file    Labs: Lab Results  Component Value Date   HIV1RNAQUANT NOT DETECTED 06/16/2024   HIV1RNAQUANT <20 01/24/2016    RPR and STI Lab Results  Component Value Date   LABRPR NON-REACTIVE 03/10/2019   LABRPR NON-REACTIVE 09/30/2018   LABRPR NON-REACTIVE 02/03/2018   LABRPR NON-REACTIVE 08/20/2017   LABRPR NON REAC 03/03/2017  STI Results GC CT  03/03/2017  8:15 AM NOT DETECTED  NOT DETECTED   09/18/2016  4:30 PM NOT DETECTED  NOT DETECTED   01/31/2016  5:15 PM NOT DETECTED  NOT DETECTED     Hepatitis B Lab Results  Component Value Date   HEPBSAB POS (A) 01/31/2016   HEPBSAG NEGATIVE 01/24/2016   HEPBCAB NON REACTIVE 01/31/2016   Hepatitis C Lab Results  Component Value Date   HEPCAB NON-REACTIVE 02/03/2018   Hepatitis A No results found for: HAV Lipids: Lab Results  Component Value Date   CHOL 202 (H) 02/03/2018   TRIG 334 (H) 02/03/2018   HDL 37 (L) 02/03/2018   CHOLHDL 5.5 (H) 02/03/2018   VLDL 65 (H) 03/03/2017   LDLCALC 117 (H) 02/03/2018    TARGET DATE: The 16th of the month  Assessment: Frederick Hampton presents today for their Apretude  injection and to follow up for HIV PrEP. No issues with past injections.  Screened patient for  acute HIV symptoms such as fatigue, muscle aches, rash, sore throat, lymphadenopathy, headache, night sweats, nausea/vomiting/diarrhea, and fever. Patient denies any symptoms.   Per [employer] guidelines, a rapid HIV test should be drawn prior to Apretude  administration. Due to state shortage of rapid HIV tests, this is temporarily unable to be done. Per decision from [facility] physicians, we will proceed with Apretude  administration at this time without a negative rapid HIV test beforehand. HIV RNA was collected today and is in process.  Administered cabotegravir  600mg /9mL in left upper outer quadrant of the gluteal muscle. Will make follow up appointments for maintenance injections every 2 months.   No known exposures to any STIs and no signs or symptoms of any STIs today. No new partners since last injection; politely declines STI testing today.   States he was charged for the $65 pharmacist office copay visit at his second appointment with me but no the first. Wondering if this is due to seeing me twice within 7 days. If he is charged this at every visit, he cannot afford these copays. In this case, will consider transitioning to Yeztugo twice annually based on insurance approval. Otherwise, will continue on Apretude .   Endorses itching on the bottom of his right foot and right calf. States he experiences frequent athlete's foot episodes due to working without AC and feet sweating. Frederick Hampton examined an area of redness on his calf where patient is itching; believes area near scarring could be treated with steroid cream. Upon further examination, Frederick Hampton did not note any noticeable athlete's foot symptoms on bottom of patient's right foot. Area had dry circular pattern of wart-like bumps; believes that steroid cream will also treat this. Sent triamcinolone  cream to [facility]. Requested patient follow up through MyChart or telephone if symptoms do not improve within a few weeks. Frederick Hampton also suggested trialing Gold  Bond powder and antiperspirant under his feet.   Plan: - Administer Apretude  600 mg x 1  - Maintenance injections scheduled for 10/9 with me  - Check HIV RNA - Prescribe triamcinolone  0.1% cream for right calf and foot  - Call with any issues or questions  Alan Geralds, PharmD, CPP, BCIDP, AAHIVP Clinical Pharmacist Practitioner Infectious Diseases Clinical Pharmacist [facility]

## 2024-07-14 ENCOUNTER — Ambulatory Visit: Admitting: Pharmacist

## 2024-07-14 ENCOUNTER — Other Ambulatory Visit: Payer: Self-pay

## 2024-07-14 DIAGNOSIS — Z113 Encounter for screening for infections with a predominantly sexual mode of transmission: Secondary | ICD-10-CM

## 2024-07-14 DIAGNOSIS — R21 Rash and other nonspecific skin eruption: Secondary | ICD-10-CM

## 2024-07-14 DIAGNOSIS — Z2981 Encounter for HIV pre-exposure prophylaxis: Secondary | ICD-10-CM | POA: Diagnosis not present

## 2024-07-14 DIAGNOSIS — Z79899 Other long term (current) drug therapy: Secondary | ICD-10-CM

## 2024-07-14 MED ORDER — TRIAMCINOLONE ACETONIDE 0.1 % EX CREA: 1.0000 | TOPICAL_CREAM | Freq: Two times a day (BID) | CUTANEOUS | 1 refills | Status: DC

## 2024-07-14 MED ORDER — CABOTEGRAVIR ER 600 MG/3ML IM SUER
600.0000 mg | Freq: Once | INTRAMUSCULAR | Status: AC
  Administered 2024-07-14 (×2): 600 mg via INTRAMUSCULAR

## 2024-07-16 LAB — HIV-1 RNA QUANT-NO REFLEX-BLD
HIV 1 RNA Quant: NOT DETECTED {copies}/mL
HIV-1 RNA Quant, Log: NOT DETECTED {Log_copies}/mL

## 2024-08-23 ENCOUNTER — Other Ambulatory Visit (HOSPITAL_COMMUNITY): Payer: Self-pay

## 2024-08-31 ENCOUNTER — Other Ambulatory Visit (HOSPITAL_COMMUNITY): Payer: Self-pay

## 2024-09-02 ENCOUNTER — Other Ambulatory Visit (HOSPITAL_COMMUNITY): Payer: Self-pay

## 2024-09-02 ENCOUNTER — Encounter: Payer: Self-pay | Admitting: Pharmacist

## 2024-09-02 ENCOUNTER — Telehealth: Payer: Self-pay

## 2024-09-02 NOTE — Telephone Encounter (Signed)
 Received notification from [facility] regarding a prior authorization for Yeztugo Tablets. Authorization has been APPROVED from 08/31/24 to 08/31/25.   Per test claim, copay for 15 days supply is $2356.20  Patient can fill through [facility]: [phone]   Authorization # [id number] Phone # [phone]  Patient is enrolled in [employer] Program

## 2024-09-03 ENCOUNTER — Other Ambulatory Visit (HOSPITAL_COMMUNITY): Payer: Self-pay

## 2024-09-06 ENCOUNTER — Telehealth: Payer: Self-pay

## 2024-09-06 NOTE — Telephone Encounter (Signed)
 Pharmacy Patient Advocate Encounter- RUE BIV-Medical Benefit:  J code: [id number] CPT code: 03627  Dx Code: Z72.53  NO PA is required through [facility], This medication was previously reviewed and approved.  Authorization# [id number]  Notification will be in media.

## 2024-09-08 NOTE — Progress Notes (Unsigned)
 HPI: Frederick Hampton is a 48 y.o. male who presents to the [facility] pharmacy clinic for Apretude  administration and HIV PrEP follow up.  Referring ID Physician: Dr. Fleeta Rothman   Patient Active Problem List   Diagnosis Date Noted   Seasonal allergies     Patient's Medications  New Prescriptions   No medications on file  Previous Medications   DICLOFENAC  SODIUM (VOLTAREN ) 1 % GEL    APPLY 2 GRAMS FOUR TIMES DAILY AS NEEDED FOR PAIN   DOXYCYCLINE  (VIBRA -TABS) 100 MG TABLET    Take 2 tablets (200 mg) by mouth 24-72 hours after condomless sex.   ESCITALOPRAM  (LEXAPRO ) 10 MG TABLET    Take 1 tablet (10 mg total) by mouth daily. Titrate dose up as tolerated, start with 1/2 pill 5mg .   FLUTICASONE  (FLONASE ) 50 MCG/ACT NASAL SPRAY    Place 1 spray daily into both nostrils.   OMEPRAZOLE  (PRILOSEC) 20 MG CAPSULE    Take 1 capsule (20 mg total) by mouth daily.   PAROXETINE  (PAXIL ) 20 MG TABLET    Take 1 tablet (20 mg total) by mouth daily.   RESEARCH STUDY MEDICATION    TDF/FTC 500mg  or Placebo Take one tablet by mouth daily with or without food  THIS IS A STUDY DISPENSED MEDICATION AND MAY BE PLACEBO-DO NOT FILL-   RESEARCH STUDY MEDICATION    Cabotegravir  30mg  or Placebo Take one tablet by mouth daily with or without food  THIS IS A STUDY DISPENSED MEDICATION AND MAY BE PLACEBO-DO NOT FILL-   TRIAMCINOLONE  CREAM (KENALOG ) 0.1 %    Apply 1 Application topically 2 (two) times daily. Apply to bottom of foot and leg.  Modified Medications   No medications on file  Discontinued Medications   No medications on file    Allergies: No Known Allergies  Past Medical History: Past Medical History:  Diagnosis Date   HTN (hypertension)    Seasonal allergies     Social History: Social History   Socioeconomic History   Marital status: Married    Spouse name: Not on file   Number of children: Not on file   Years of education: Not on file   Highest education level: Not on file  Occupational  History   Not on file  Tobacco Use   Smoking status: Some Days    Types: Cigarettes    Start date: 12/02/2005   Smokeless tobacco: Not on file   Tobacco comments:    1 cigarrette per day  Substance and Sexual Activity   Alcohol use: Yes    Alcohol/week: 1.0 standard drink of alcohol    Types: 1 Standard drinks or equivalent per week   Drug use: Not on file   Sexual activity: Not on file  Other Topics Concern   Not on file  Social History Narrative   Not on file   Social Drivers of Health   Financial Resource Strain: Not on file  Food Insecurity: Not on file  Transportation Needs: Not on file  Physical Activity: Not on file  Stress: Not on file  Social Connections: Unknown (04/15/2022)   Received from [facility]   Social Network    Social Network: Not on file    Labs: Lab Results  Component Value Date   HIV1RNAQUANT NOT DETECTED 07/14/2024   HIV1RNAQUANT NOT DETECTED 06/16/2024   HIV1RNAQUANT <20 01/24/2016    RPR and STI Lab Results  Component Value Date   LABRPR NON-REACTIVE 03/10/2019   LABRPR NON-REACTIVE 09/30/2018  LABRPR NON-REACTIVE 02/03/2018   LABRPR NON-REACTIVE 08/20/2017   LABRPR NON REAC 03/03/2017    STI Results GC CT  03/03/2017  8:15 AM NOT DETECTED  NOT DETECTED   09/18/2016  4:30 PM NOT DETECTED  NOT DETECTED   01/31/2016  5:15 PM NOT DETECTED  NOT DETECTED     Hepatitis B Lab Results  Component Value Date   HEPBSAB POS (A) 01/31/2016   HEPBSAG NEGATIVE 01/24/2016   HEPBCAB NON REACTIVE 01/31/2016   Hepatitis C Lab Results  Component Value Date   HEPCAB NON-REACTIVE 02/03/2018   Hepatitis A No results found for: HAV Lipids: Lab Results  Component Value Date   CHOL 202 (H) 02/03/2018   TRIG 334 (H) 02/03/2018   HDL 37 (L) 02/03/2018   CHOLHDL 5.5 (H) 02/03/2018   VLDL 65 (H) 03/03/2017   LDLCALC 117 (H) 02/03/2018    TARGET DATE: The 16th of the month  Assessment: Frederick Hampton presents today for their Apretude  injection  and to follow up for HIV PrEP. No issues with past injections.  Screened patient for acute HIV symptoms such as fatigue, muscle aches, rash, sore throat, lymphadenopathy, headache, night sweats, nausea/vomiting/diarrhea, and fever. Patient denies any symptoms.   Per [employer] guidelines, a rapid HIV test should be drawn prior to Apretude  administration. Due to state shortage of rapid HIV tests, this is temporarily unable to be done. Per decision from [facility] physicians, we will proceed with Apretude  administration at this time without a negative rapid HIV test beforehand. HIV RNA was collected today and is in process.  Administered cabotegravir  600mg /40mL in *** upper outer quadrant of the gluteal muscle. Will make follow up appointments for maintenance injections every 2 months.   No known exposures to any STIs and no signs or symptoms of any STIs today. Last STI screening was *** and was negative. *** new partners since last injection; will check *** today.   Plan: - Administer Apretude  600 mg x 1  - Maintenance injections scheduled for *** - Check HIV RNA and *** - Call with any issues or questions  Alan Geralds, PharmD, CPP, BCIDP, AAHIVP Clinical Pharmacist Practitioner Infectious Diseases Clinical Pharmacist [facility]

## 2024-09-09 ENCOUNTER — Other Ambulatory Visit: Payer: Self-pay

## 2024-09-09 ENCOUNTER — Ambulatory Visit (INDEPENDENT_AMBULATORY_CARE_PROVIDER_SITE_OTHER): Payer: Self-pay | Admitting: Pharmacist

## 2024-09-09 ENCOUNTER — Other Ambulatory Visit (HOSPITAL_COMMUNITY): Payer: Self-pay

## 2024-09-09 DIAGNOSIS — R21 Rash and other nonspecific skin eruption: Secondary | ICD-10-CM

## 2024-09-09 DIAGNOSIS — Z113 Encounter for screening for infections with a predominantly sexual mode of transmission: Secondary | ICD-10-CM

## 2024-09-09 DIAGNOSIS — Z79899 Other long term (current) drug therapy: Secondary | ICD-10-CM

## 2024-09-09 MED ORDER — TRIAMCINOLONE ACETONIDE 0.1 % EX CREA: 1.0000 | TOPICAL_CREAM | Freq: Two times a day (BID) | CUTANEOUS | 1 refills | Status: AC

## 2024-09-11 LAB — HIV-1 RNA QUANT-NO REFLEX-BLD
HIV 1 RNA Quant: NOT DETECTED {copies}/mL
HIV-1 RNA Quant, Log: NOT DETECTED {Log_copies}/mL

## 2024-09-14 ENCOUNTER — Ambulatory Visit: Admitting: Family Medicine

## 2024-09-15 ENCOUNTER — Ambulatory Visit: Admitting: Pharmacist

## 2024-12-14 NOTE — Telephone Encounter (Signed)
 Patient needs to respond back to voicemail and or my chart message. He will need an appointment

## 2024-12-17 ENCOUNTER — Other Ambulatory Visit (HOSPITAL_COMMUNITY): Payer: Self-pay
